# Patient Record
Sex: Female | Born: 1960 | Race: Black or African American | Hispanic: No | Smoking: Never smoker
Health system: Southern US, Community
[De-identification: ages and names within clinical notes are randomized; demographics above are authoritative.]

## PROBLEM LIST (undated history)

## (undated) DIAGNOSIS — I10 Essential (primary) hypertension: Secondary | ICD-10-CM

## (undated) DIAGNOSIS — E119 Type 2 diabetes mellitus without complications: Secondary | ICD-10-CM

## (undated) DIAGNOSIS — Z8489 Family history of other specified conditions: Secondary | ICD-10-CM

## (undated) DIAGNOSIS — E785 Hyperlipidemia, unspecified: Secondary | ICD-10-CM

## (undated) DIAGNOSIS — R7301 Impaired fasting glucose: Secondary | ICD-10-CM

## (undated) HISTORY — PX: ENDOMETRIAL ABLATION: SHX621

## (undated) HISTORY — DX: Hyperlipidemia, unspecified: E78.5

## (undated) HISTORY — DX: Impaired fasting glucose: R73.01

## (undated) HISTORY — PX: TONSILLECTOMY: SUR1361

---

## 1985-06-21 HISTORY — PX: TUBAL LIGATION: SHX77

## 2005-12-05 ENCOUNTER — Emergency Department (HOSPITAL_COMMUNITY): Admission: EM | Admit: 2005-12-05 | Discharge: 2005-12-05 | Payer: Self-pay | Admitting: Emergency Medicine

## 2005-12-09 ENCOUNTER — Ambulatory Visit: Payer: Self-pay | Admitting: Cardiology

## 2005-12-09 ENCOUNTER — Inpatient Hospital Stay (HOSPITAL_COMMUNITY): Admission: EM | Admit: 2005-12-09 | Discharge: 2005-12-11 | Payer: Self-pay | Admitting: Emergency Medicine

## 2005-12-10 ENCOUNTER — Encounter: Payer: Self-pay | Admitting: Cardiology

## 2005-12-16 ENCOUNTER — Ambulatory Visit (HOSPITAL_COMMUNITY): Admission: RE | Admit: 2005-12-16 | Discharge: 2005-12-16 | Payer: Self-pay | Admitting: Obstetrics and Gynecology

## 2006-04-01 ENCOUNTER — Encounter (INDEPENDENT_AMBULATORY_CARE_PROVIDER_SITE_OTHER): Payer: Self-pay | Admitting: Specialist

## 2006-04-01 ENCOUNTER — Ambulatory Visit (HOSPITAL_COMMUNITY): Admission: RE | Admit: 2006-04-01 | Discharge: 2006-04-01 | Payer: Self-pay | Admitting: Obstetrics and Gynecology

## 2008-01-15 ENCOUNTER — Ambulatory Visit (HOSPITAL_COMMUNITY): Admission: RE | Admit: 2008-01-15 | Discharge: 2008-01-15 | Payer: Self-pay | Admitting: Family Medicine

## 2009-06-12 ENCOUNTER — Ambulatory Visit (HOSPITAL_COMMUNITY): Admission: RE | Admit: 2009-06-12 | Discharge: 2009-06-12 | Payer: Self-pay | Admitting: Obstetrics and Gynecology

## 2010-07-09 ENCOUNTER — Ambulatory Visit (HOSPITAL_COMMUNITY)
Admission: RE | Admit: 2010-07-09 | Discharge: 2010-07-09 | Payer: Self-pay | Source: Home / Self Care | Attending: Obstetrics and Gynecology | Admitting: Obstetrics and Gynecology

## 2010-11-06 NOTE — Discharge Summary (Signed)
[name], [name]                  ACCOUNT NO.:  [id number]   MEDICAL RECORD NO.:  [id number]          PATIENT TYPE:  INP   LOCATION:  1426                         FACILITY:  [facility]   PHYSICIAN:  Lonia Blood, M.D.      DATE OF BIRTH:  [birth year]   DATE OF ADMISSION:  12/09/2005  DATE OF DISCHARGE:  12/11/2005                                 DISCHARGE SUMMARY   PRIMARY CARE PHYSICIAN:  Dr. Gerda Diss of [place], so she is unassigned  here.   DISCHARGE DIAGNOSES:  1.  Blood loss anemia.  2.  Heavy menses with prolonged vaginal bleed.  3.  Transient non-cardiac chest pain, myocardial infarction ruled out.  4.  Hypertension.   DISCHARGE MEDICATIONS:  1.  Hydrochlorothiazide 25 mg daily.  2.  Lisinopril 20 mg daily.  3.  Chromagen 40 two tablets daily.   DISPOSITION:  The patient is being discharged home with stable hemoglobin.  She is to follow up with Dr. Ambrose Mantle and to call his office on Monday.  She  might end up requiring and having a total hysterectomy at some point.   PROCEDURES PERFORMED:  A 2-D echocardiogram performed on December 10, 2005  showed overall normal left ventricular function with moderately increased  wall thickness of the left ventricle.   CONSULTATIONS:  None.   BRIEF HISTORY AND PHYSICAL:  Please refer to dictated history and physical  by Dr. Michaelyn Barter.  In short, however, this is a 50 year old female  who has been having heavy periods for a couple of months now.  She had a  syncopal episode apparently last week where she was found to have a low  hemoglobin.  She was subsequently told that her low hemoglobin has improved.  She was placed on Chromagen forte.  This time around, she came to visit with  a new gynecologist here in [place], who is Dr. Ambrose Mantle.  While at that  visit, she complained of chest pain.  Hence, she was sent to the emergency  room.  In the emergency room, the patient was found to be anemic with a  hemoglobin of 8.5.  She was also  found to be bleeding.  She was subsequently  admitted for evaluation of both her chest pain and her low hemoglobin.   HOSPITAL COURSE:  1.  Chest pain.  The patient was admitted to a telemetry bed.  She was      started on pain control, but no aspirin secondary to her bleeding.  She      got cardiac enzymes checked x3 that were all negative so far.  Her chest      pain was, therefore, felt to be non-cardiac in origin.  She did not have      chest pain, in fact, since her arrival in the ED.  She described the      previous chest pain she had as a dull ache that was transient and has      since gone.  At this point, no further cardiac work-up was felt to be      warranted,  except for a 2-D echo that was essentially as indicated      above.  2.  Anemia.  The patient's hemoglobin was low, as indicated, most likely      from blood loss.  She received 2 packs of red blood cells.  Her      hemoglobin has stabilized around 9.5 yesterday and today.  3.  Vaginal bleed.  The patient will probably require a hysterectomy.  Since      she has an established gynecologist, she is asked to call him the first      thing on Monday to scheduled an appointment and see if he plans to do      anything about it.  4.  Hypertension.  Her blood pressure was well-controlled on her home      medications in the hospital.  We, therefore, did not make any new      changes to her medicines this time around.  We did check a fasting lipid      panel and a TSH, which were all normal.      Lonia Blood, M.D.  Electronically Signed     LG/MEDQ  D:  12/11/2005  T:  12/11/2005  Job:  [id number]

## 2010-11-06 NOTE — H&P (Signed)
[name], PEDONE                  ACCOUNT NO.:  [id number]   MEDICAL RECORD NO.:  [id number]          PATIENT TYPE:  AMB   LOCATION:  SDC                           FACILITY:  [facility]   PHYSICIAN:  Janine Limbo, M.D.DATE OF BIRTH:  1960-09-23   DATE OF ADMISSION:  04/01/2006  DATE OF DISCHARGE:                                HISTORY & PHYSICAL   HISTORY OF PRESENT ILLNESS:  [name] is a 50 year old female, para 3-0-0-3,  who present for hysteroscopy with dilation and curettage and resection of  endometrial polyp.  She will also have a Novasure ablation.  The patient  complains of menometrorrhagia.  Her hemoglobin has been as low as 5.5.  The  patient had an ultrasound that showed polyps within the endometrial cavity.  The uterus itself measured 10.2 by 6.5 cm.  An endometrial biopsy was  benign.  Her Pap smear was within normal limits.  Her mammogram was within  normal limits.  The patient is status post tubal ligation.  The patient does  not desire a hysterectomy.   OBSTETRICAL HISTORY:  The patient has had three term vaginal deliveries.   ALLERGIES:  NO KNOWN DRUG ALLERGIES.   PAST MEDICAL HISTORY:  The patient has hypertension and she currently takes  Lisinopril and hydrochlorothiazide.   SOCIAL HISTORY:  The patient denies cigarette use, alcohol use, and  recreational drug use.   REVIEW OF SYSTEMS:  See history of present illness.   FAMILY HISTORY:  The patient has a family history of hypertension and  diabetes.   PHYSICAL EXAMINATION:  VITAL SIGNS:  Weight is 254 pounds, height is 5 feet  7 inches.  HEENT:  Ears within normal limits.  CHEST:  Clear.  HEART:  Regular rate and rhythm.  BREASTS:  Without masses.  ABDOMEN:  Nontender.  EXTREMITIES: Grossly normal.  NEUROLOGIC:  Exam is grossly normal.  PELVIC:  External genitalia are normal.  Vagina is normal.  Cervix is  nontender.  Uterus is upper limits of normal size and regular.  Adnexa have  no masses,  rectovaginal exam confirms.   ASSESSMENT:  1. Metromenorrhagia.  2. Severe anemia.  3. Hypertension.  4. Obesity (weight 254 pounds).   PLAN:  The patient will undergo hysteroscopy with resection of polyps.  She  will also have a dilatation and curettage followed by Novasure ablation.  The patient understands the indications for her procedure and she accepts  the risk of, but not limited to, anesthetic complications, bleeding,  infections, and possible damage to the surrounding organs.      Janine Limbo, M.D.  Electronically Signed     AVS/MEDQ  D:  03/31/2006  T:  04/01/2006  Job:  [id number]

## 2010-11-06 NOTE — H&P (Signed)
[name], [name]                  ACCOUNT NO.:  [id number]   MEDICAL RECORD NO.:  [id number]          PATIENT TYPE:  EMS   LOCATION:  ED                           FACILITY:  [facility]   PHYSICIAN:  Michaelyn Barter, M.D. DATE OF BIRTH:  08-02-1960   DATE OF ADMISSION:  12/09/2005  DATE OF DISCHARGE:                                HISTORY & PHYSICAL   PRIMARY CARE PHYSICIAN:  Dr. Gerda Diss; therefore she is unassigned.   CHIEF COMPLAINT:  Heavy periods, shortness of breath, chest pain.   HISTORY OF PRESENT ILLNESS:  [name] is a 50 year old female who states  that she started her menstruation back in October 2006.  She has been  constantly bleeding since that time. She states that she bleeds every day.  She saw her gynecologist, Dr. Cordelia Pen, in [place] back in October or  November of last year.  A Pap smear was done at that particular time.  However, she never found out the results. Her bleeding continued and she  went back to see Dr. Bernette Redbird in February.  When she requested to know the  results of the prior Pap smear she was told that they could not locate the  results, therefore a repeat Pap smear was completed. The patient does not  know the results of the second Pap smear either. She states that  occasionally her bleeding will slow down to the point that she only spots.  However, it never completely goes away.  On Thursday, December 02, 2005, she  went to see her primary care physician and was seen by his nurse  practitioner instead, Ms. Gwyneth Sprout.  She was told that her hemoglobin  was 5.8 and she was sent to [facility] to have bloodwork completed.  This last Friday she was told that her iron was low, she was started on  Chromagen forte two tablets daily. Two days later, this past Sunday morning,  she got up to go to church.  She states that she felt very tired and short  of breath, especially when walking short distances across her home. She had  to sit down as a result of  the shortness of breath.  She went to church;  however, when she went to pray in church she felt as though her lips were  numb.  She also felt dizzy every time she went to stand. She went back to  [facility] to have her hemoglobin rechecked and was told that it was 8.  Today, she went to see Dr. Ambrose Mantle, a new gynecologist, and at that time she  complained of chest pain. She states that she has had at least six to seven  episodes of chest pain since this past weekend. The pain is described as  sharp. She also has some jaw pain during those episodes of chest pain. She  states that rest tends to ease her pain. She states that when she is  cleaning her house, in particular sweeping her floor, she tends to have her  chest pain triggered. After mentioning this to Dr. Ambrose Mantle he referred  her to  the hospital for further evaluation. She states that the chest pain reaches  a 5/10 in intensity. She denies having any orthopnea, no PND. She states  that she has had a couple of episodes where it felt as though her heartbeat  was racing. She also states that currently she is menstruating.   PAST MEDICAL HISTORY:  1.  Hypertension.  2.  Anemia.   PAST SURGICAL HISTORY:  1.  Tubal ligation.  2.  Anemia.   PAST SURGICAL HISTORY:  1.  Tubal ligation.  2.  Tonsillectomy.   ALLERGIES:  None.   HOME MEDICATIONS:  1.  Lisinopril 20 mg p.o. daily.  2.  Hydrochlorothiazide 25 mg p.o. daily.  3.  Chromagen Forte two tablets daily.   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 Cigarette--never.  Alcohol--never.   FAMILY HISTORY:  Mother has history of hypercholesterolemia. Father had a  heart attack while in his 81s. He has a history of hypertension, diabetes  mellitus, and also status post pacemaker placement.   REVIEW OF SYSTEMS:  As per HPI.  Otherwise, all other systems are negative.   PHYSICAL EXAMINATION:  GENERAL: The patient is awake, she is  cooperative.  She is in no obvious distress.  VITAL SIGNS: Temperature 98.9, blood pressure 156/94, heart rate 107,  respirations 17, O2 saturation 98% on room air.  HEENT:  Normocephalic and atraumatic. Anicteric. Extraocular movements are  intact. Pupils equal and reactive to light. Oral mucosa is pink. No  exudates.  NECK: Supple with no JVD or lymphadenopathy. Thyroid is not palpable.  CARDIAC: S1 and S2 is present. Regular rate and rhythm. No S3 or S4. No  murmurs, rubs, or gallops.  RESPIRATORY:  Clear.  ABDOMEN: Soft,  nontender, nondistended.  Hypoactive bowel sounds. No  organomegaly.  EXTREMITIES: No leg edema.  NEUROLOGIC: The patient is alert and oriented times three.  MUSCULOSKELETAL:  5/5 upper and lower extremity strength.   LABORATORY DATA:  White blood cell count 7.4, hemoglobin 8.5, hematocrit  27.6, platelet count 397,000. Sodium 137, potassium 3.9, chloride 101, CO2  28, BUN 22, creatinine 0.9, glucose 102, calcium 10.2. CK-MB POC 1.8.  Troponin-I, POC less than 0.05.  Myoglobin, POC 41.7.   EKG reveals normal sinus rhythm, no Q waves.   ASSESSMENT/PLAN:  1.  Severe anemia. This most is likely related to the patient's excessive      bleeding occurring during extended menstrual cycle/menorrhagia. Will      type and cross the patient two units of packed red blood cells and then      transfuse them. Will consider consultation with gynecology. The patient      more than likely may need a hysterectomy at some time in the near      future. Will also repeat the patient's hemoglobin and hematocrit after      the transfusion is completed.  2.  Chest pain. This may have been triggered by the severe anemia that the      patient has experienced.  In fact, she may have experienced some      ischemia secondary to severe decline in her hemoglobin. Will cycle the      patient's     cardiac enzymes for now times three every eight hours apart, and will      consider  consultation with cardiology.  3.  Hypertension. Will resume the patient's previously prescribed  antihypertensive medications.  4.  Gastrointestinal prophylaxis.  Will provide Protonix.      Michaelyn Barter, M.D.  Electronically Signed     OR/MEDQ  D:  12/09/2005  T:  12/09/2005  Job:  [id number]

## 2010-11-06 NOTE — Op Note (Signed)
NAMERHYTHM, GUBBELS                  ACCOUNT NO.:  [id number]   MEDICAL RECORD NO.:  [id number]          PATIENT TYPE:  AMB   LOCATION:  SDC                           FACILITY:  [facility]   PHYSICIAN:  Janine Limbo, M.D.DATE OF BIRTH:  1960/07/14   DATE OF PROCEDURE:  04/01/2006  DATE OF DISCHARGE:  04/01/2006                                 OPERATIVE REPORT   PREOPERATIVE DIAGNOSES:  1. Anemia.  2. Menometrorrhagia.  3. Obesity.  4. Endometrial polyps.  5. Hypertension.   POSTOPERATIVE DIAGNOSES:  1. Anemia.  2. Menometrorrhagia.  3. Obesity.  4. Endometrial polyps.  5. Hypertension.   PROCEDURE:  1. Hysteroscopy with resection.  2. Dilatation and curettage.  3. NovaSure ablation of the endometrium.   SURGEON:  Dr. Leonard Schwartz.   ANESTHETIC:  General.   DISPOSITION:  [name] is a 50 year old female, para 3-0-0-3, who presents  with the above-mentioned diagnoses.  She does not desire hysterectomy.  She  understands the indications for her surgical procedure, and she accepts the  risk of, but not limited to, anesthetic complications, bleeding, infections,  and possible damage to the surrounding organs.   FINDINGS:  The uterus was approximately 8-10 weeks in size.  The uterus  sounded to 9 cm.  The endometrial cavity showed polyps that were relatively  small, measuring 1 cm or less in size.  The tubal ostia were easily seen.  There was no evidence of pathology.  No adnexal masses were appreciated.   PROCEDURE:  The patient was taken to the operating room where a general  anesthetic was given.  The patient's abdomen, perineum, and vagina were  prepped with multiple layers of Betadine.  Examination under anesthesia was  performed.  The patient was sterilely draped after the bladder was drained  of urine.  A paracervical block was placed using 10 mL of 0.5% Marcaine with  epinephrine.  An additional 10 mL of 0.5% Marcaine with epinephrine were  injected  directly into the cervix.  An endocervical curettage was performed.  The uterus sounded to 9 cm.  The cervical length was noted to be 4 cm.  The  cervix was gently dilated.  The operative hysteroscope was inserted, and the  cavity was carefully inspected.  Pictures were taken.  Polyps on the right  upper portion of the endometrium were resected, and polyps on the left lower  portion of the endometrium were resected.  The operative hysteroscope was  then removed, and the cavity was curetted with a medium sharp curette.  The  cavity was felt to be clean at the end of our procedure.  The NovaSure  instrument was then inserted, and the cavity width was noted to be 3.6 cm.  Proper placement was confirmed.  The cavity was tested for integrity, and  there was no evidence of perforation.  We then ablated the uterus for 1  minute and 48 seconds with a power of 99.  The procedure was tolerated well,  and there were no complications.  The NovaSure instrument was removed.  Hemostasis was noted to  be adequate.  The uterus was again examined and was  noted to be firm.  Sponge, needle, and instrument counts were correct.  The  patient was returned to the supine position.  She was awakened from her  anesthetic and taken to the recovery room in stable condition.  She  tolerated her procedure well.  The estimated sorbitol deficit was 90 mL.  Lactated Ringer's was used to irritate the uterine cavity between the  resection and the ablation.  The patient was given Toradol 30 mg IV and 30  mg IM during the operative procedure.   FOLLOW UP INSTRUCTIONS:  The patient will take ibuprofen 600 mg every 6  hours as needed for mild to moderate pain.  She will take Vicodin 1-2  tablets every 4 hours as needed for severe pain.  She will call for  questions or concerns.  She will return to see Dr. Stefano Gaul in 2-3 weeks for  follow-up examination.  She was given a copy of the postoperative  instruction sheet as prepared  by the [facility] for  patients who have undergone a dilatation and curettage.      Janine Limbo, M.D.  Electronically Signed     AVS/MEDQ  D:  04/01/2006  T:  04/03/2006  Job:  [id number]

## 2011-08-04 ENCOUNTER — Other Ambulatory Visit: Payer: Self-pay | Admitting: Obstetrics and Gynecology

## 2011-08-04 ENCOUNTER — Other Ambulatory Visit: Payer: Self-pay | Admitting: Family Medicine

## 2011-08-04 DIAGNOSIS — Z139 Encounter for screening, unspecified: Secondary | ICD-10-CM

## 2011-08-06 ENCOUNTER — Ambulatory Visit (HOSPITAL_COMMUNITY)
Admission: RE | Admit: 2011-08-06 | Discharge: 2011-08-06 | Disposition: A | Discharge status: Home / Self Care | Payer: BC Managed Care – PPO | Source: Ambulatory Visit | Attending: Obstetrics and Gynecology | Admitting: Obstetrics and Gynecology

## 2011-08-06 DIAGNOSIS — Z1231 Encounter for screening mammogram for malignant neoplasm of breast: Secondary | ICD-10-CM | POA: Insufficient documentation

## 2011-08-06 DIAGNOSIS — Z139 Encounter for screening, unspecified: Secondary | ICD-10-CM

## 2011-09-20 DIAGNOSIS — R7301 Impaired fasting glucose: Secondary | ICD-10-CM

## 2011-09-20 HISTORY — DX: Impaired fasting glucose: R73.01

## 2011-10-24 ENCOUNTER — Emergency Department (HOSPITAL_COMMUNITY)
Admission: EM | Admit: 2011-10-24 | Discharge: 2011-10-24 | Disposition: A | Discharge status: Home / Self Care | Payer: BC Managed Care – PPO | Attending: Emergency Medicine | Admitting: Emergency Medicine

## 2011-10-24 ENCOUNTER — Encounter (HOSPITAL_COMMUNITY): Payer: Self-pay

## 2011-10-24 DIAGNOSIS — I10 Essential (primary) hypertension: Secondary | ICD-10-CM | POA: Insufficient documentation

## 2011-10-24 DIAGNOSIS — T503X1A Poisoning by electrolytic, caloric and water-balance agents, accidental (unintentional), initial encounter: Secondary | ICD-10-CM | POA: Insufficient documentation

## 2011-10-24 DIAGNOSIS — R42 Dizziness and giddiness: Secondary | ICD-10-CM | POA: Insufficient documentation

## 2011-10-24 DIAGNOSIS — T50901A Poisoning by unspecified drugs, medicaments and biological substances, accidental (unintentional), initial encounter: Secondary | ICD-10-CM

## 2011-10-24 DIAGNOSIS — T502X1A Poisoning by carbonic-anhydrase inhibitors, benzothiadiazides and other diuretics, accidental (unintentional), initial encounter: Secondary | ICD-10-CM | POA: Insufficient documentation

## 2011-10-24 HISTORY — DX: Essential (primary) hypertension: I10

## 2011-10-24 NOTE — ED Notes (Signed)
Pt reports that she took 1 extra dose of her usual bp med--amlodipine-benaz-10/20mg , apporx. 15 min pta

## 2011-10-24 NOTE — ED Provider Notes (Signed)
History     CSN: [id number]  Arrival date & time 10/24/11  0750   First MD Initiated Contact with Patient 10/24/11 [phone]      Chief Complaint  Patient presents with  . Drug Overdose    (Consider location/radiation/quality/duration/timing/severity/associated sxs/prior treatment) HPI Comments: Pt accidentally took 2 rather 1 of her BP tablets.  She denies any sxs of lightheadedness or feeling as though she is going to pass out.  She wanted to be "checked".  Patient is a 51 y.o. female presenting with Overdose. The history is provided by the patient. No language interpreter was used.  Drug Overdose This is a new problem. The current episode started today (0800). Pertinent negatives include no chest pain, diaphoresis, fatigue, nausea, vertigo, vomiting or weakness.    Past Medical History  Diagnosis Date  . Hypertension     History reviewed. No pertinent past surgical history.  No family history on file.  History  Substance Use Topics  . Smoking status: Never Smoker   . Smokeless tobacco: Not on file  . Alcohol Use: No    OB History    Grav Para Term Preterm Abortions TAB SAB Ect Mult Living                  Review of Systems  Constitutional: Negative for diaphoresis and fatigue.  Respiratory: Negative for shortness of breath.   Cardiovascular: Negative for chest pain.  Gastrointestinal: Negative for nausea and vomiting.  Neurological: Negative for vertigo, syncope, weakness and light-headedness.    Allergies  Review of patient's allergies indicates no known allergies.  Home Medications   Current Outpatient Rx  Name Route Sig Dispense Refill  . AMLODIPINE BESY-BENAZEPRIL HCL 10-20 MG PO CAPS Oral Take 1 capsule by mouth daily.    Marland Kitchen HYDROCHLOROTHIAZIDE 12.5 MG PO CAPS Oral Take 12.5 mg by mouth daily. Ran out      BP 168/98  Pulse 71  Temp(Src) 98.2 F (36.8 C) (Oral)  Resp 20  Ht 5\' 7"  (1.702 m)  Wt 262 lb (118.842 kg)  BMI 41.03 kg/m2  SpO2  97%  Physical Exam  Nursing note and vitals reviewed. Constitutional: She is oriented to person, place, and time. She appears well-developed and well-nourished. No distress.  HENT:  Head: Normocephalic and atraumatic.  Eyes: EOM are normal.  Neck: Normal range of motion.  Cardiovascular: Normal rate, regular rhythm and normal heart sounds.   Pulmonary/Chest: Effort normal and breath sounds normal.  Abdominal: Soft. She exhibits no distension. There is no tenderness.  Musculoskeletal: Normal range of motion.  Neurological: She is alert and oriented to person, place, and time. She has normal strength. No cranial nerve deficit or sensory deficit. Coordination and gait normal. GCS eye subscore is 4. GCS verbal subscore is 5. GCS motor subscore is 6.  Skin: Skin is warm and dry.  Psychiatric: She has a normal mood and affect. Judgment normal.    ED Course  Procedures (including critical care time)  Labs Reviewed - No data to display No results found.   1. Overdose       MDM  Blood pressure re-checked ~ 099 by RN and is still elevated.  Pt is still symptom free.  Pt has been told that if she has any feeling of lightheadedness or other pre-syncopal sxs that she should lie down.  If any sxs don't resolve that she should return to the ED.        Worthy Rancher, [place] 10/24/11 [phone]

## 2011-10-24 NOTE — Discharge Instructions (Signed)
Accidental Overdose  A drug overdose occurs when a chemical substance (drug or medication) is used in amounts large enough to overcome a person. This may result in severe illness or death. This is a type of poisoning. Accidental overdoses of medications or other substances come from a variety of reasons. When this happens accidentally, it is often because the person taking the substance does not know enough about what they have taken. Drugs which commonly cause overdose deaths are alcohol, psychotropic medications (medications which affect the mind), pain medications, illegal drugs (street drugs) such as cocaine and heroin, and multiple drugs taken at the same time. It may result from careless behavior (such as over-indulging at a party). Other causes of overdose may include multiple drug use, a lapse in memory, or drug use after a period of no drug use.   Sometimes overdosing occurs because a person cannot remember if they have taken their medication.   A common unintentional overdose in young children involves multi-vitamins containing iron. Iron is a part of the hemoglobin molecule in blood. It is used to transport oxygen to living cells. When taken in small amounts, iron allows the body to restock hemoglobin. In large amounts, it causes problems in the body. If this overdose is not treated, it can lead to death.  Never take medicines that show signs of tampering or do not seem quite right. Never take medicines in the dark or in poor lighting. Read the label and check each dose of medicine before you take it. When adults are poisoned, it happens most often through carelessness or lack of information. Taking medicines in the dark or taking medicine prescribed for someone else to treat the same type of problem is a dangerous practice.  SYMPTOMS   Symptoms of overdose depend on the medication and amount taken. They can vary from over-activity with stimulant over-dosage, to sleepiness from depressants such as  alcohol, narcotics and tranquilizers. Confusion, dizziness, nausea and vomiting may be present. If problems are severe enough coma and death may result.  DIAGNOSIS   Diagnosis and management are generally straightforward if the drug is known. Otherwise it is more difficult. At times, certain symptoms and signs exhibited by the patient, or blood tests, can reveal the drug in question.   TREATMENT   In an emergency department, most patients can be treated with supportive measures. Antidotes may be available if there has been an overdose of opioids or benzodiazepines. A rapid improvement will often occur if this is the cause of overdose.  At home or away from medical care:   There may be no immediate problems or warning signs in children.   Not everything works well in all cases of poisoning.   Take immediate action. Poisons may act quickly.   If you think someone has swallowed medicine or a household product, and the person is unconscious, having seizures (convulsions), or is not breathing, immediately call for an ambulance.  IF a person is conscious and appears to be doing OK but has swallowed a poison:   Do not wait to see what effect the poison will have. Immediately call a poison control center (listed in the white pages of your telephone book under "Poison Control" or inside the front cover with other emergency numbers). Some poison control centers have TTY capability for the deaf. Check with your local center if you or someone in your family requires this service.   Keep the container so you can read the label on the product for ingredients.     and condition of the person poisoned. Inform them if the person is vomiting, choking, drowsy, shows a change in color or temperature of skin, is conscious or unconscious, or is convulsing.   Do not cause vomiting unless instructed by medical personnel. Do not induce vomiting or force liquids into a  person who is convulsing, unconscious, or very drowsy.  Stay calm and in control.   Activated charcoal also is sometimes used in certain types of poisoning and you may wish to add a supply to your emergency medicines. It is available without a prescription. Call a poison control center before using this medication.  PREVENTION  Thousands of children die every year from unintentional poisoning. This may be from household chemicals, poisoning from carbon monoxide in a car, taking their parent's medications, or simply taking a few iron pills or vitamins with iron. Poisoning comes from unexpected sources.  Store medicines out of the sight and reach of children, preferably in a locked cabinet. Do not keep medications in a food cabinet. Always store your medicines in a secure place. Get rid of expired medications.   If you have children living with you or have them as occasional guests, you should have child-resistant caps on your medicine containers. Keep everything out of reach. Child proof your home.   If you are called to the telephone or to answer the door while you are taking a medicine, take the container with you or put the medicine out of the reach of small children.   Do not take your medication in front of children. Do not tell your child how good a medication is and how good it is for them. They may get the idea it is more of a treat.   If you are an adult and have accidentally taken an overdose, you need to consider how this happened and what can be done to prevent it from happening again. If this was from a street drug or alcohol, determine if there is a problem that needs addressing. If you are not sure a problems exists, it is easy to talk to a professional and ask them if they think you have a problem. It is better to handle this problem in this way before it happens again and has a much worse consequence.  Document Released: 08/21/2004 Document Revised: 05/27/2011 Document Reviewed:  01/27/2009 [facility] Patient Information 2012 [place], [place].   Return to the ED if you have any symptoms of lightheadedness or feeling like you are going to pass out that don't resolve with lying down.

## 2011-10-25 NOTE — ED Provider Notes (Signed)
Medical screening examination/treatment/procedure(s) were conducted as a shared visit with non-physician practitioner(s) and myself.  I personally evaluated the patient during the encounter.  Patient is alert and hemodynamically stable. can discharge  Donnetta Hutching, MD 10/25/11 [id number]

## 2011-11-13 ENCOUNTER — Emergency Department (HOSPITAL_COMMUNITY)
Admission: EM | Admit: 2011-11-13 | Discharge: 2011-11-13 | Disposition: A | Discharge status: Home / Self Care | Payer: BC Managed Care – PPO | Attending: Emergency Medicine | Admitting: Emergency Medicine

## 2011-11-13 ENCOUNTER — Emergency Department (HOSPITAL_COMMUNITY): Payer: BC Managed Care – PPO

## 2011-11-13 ENCOUNTER — Encounter (HOSPITAL_COMMUNITY): Payer: Self-pay

## 2011-11-13 DIAGNOSIS — I10 Essential (primary) hypertension: Secondary | ICD-10-CM | POA: Insufficient documentation

## 2011-11-13 DIAGNOSIS — M5137 Other intervertebral disc degeneration, lumbosacral region: Secondary | ICD-10-CM | POA: Insufficient documentation

## 2011-11-13 DIAGNOSIS — M51379 Other intervertebral disc degeneration, lumbosacral region without mention of lumbar back pain or lower extremity pain: Secondary | ICD-10-CM | POA: Insufficient documentation

## 2011-11-13 DIAGNOSIS — M549 Dorsalgia, unspecified: Secondary | ICD-10-CM

## 2011-11-13 LAB — URINALYSIS, ROUTINE W REFLEX MICROSCOPIC
Hgb urine dipstick: NEGATIVE
Ketones, ur: NEGATIVE mg/dL
Leukocytes, UA: NEGATIVE
Protein, ur: 30 mg/dL — AB
Specific Gravity, Urine: >1.03 — ABNORMAL HIGH (ref 1.005–1.030)
pH: 5.5 (ref 5.0–8.0)

## 2011-11-13 LAB — URINE MICROSCOPIC-ADD ON

## 2011-11-13 MED ORDER — NAPROXEN 500 MG PO TABS: 500.0000 mg | ORAL_TABLET | Freq: Two times a day (BID) | ORAL | Status: AC

## 2011-11-13 MED ORDER — OXYCODONE-ACETAMINOPHEN 5-325 MG PO TABS
1.0000 | ORAL_TABLET | ORAL | Status: AC | PRN
Start: 2011-11-13 — End: 2011-11-23

## 2011-11-13 MED ORDER — OXYCODONE-ACETAMINOPHEN 5-325 MG PO TABS
2.0000 | ORAL_TABLET | Freq: Once | ORAL | Status: AC
  Administered 2011-11-13: 2 via ORAL
  Filled 2011-11-13: qty 2

## 2011-11-13 NOTE — Discharge Instructions (Signed)
Your back pain should be treated with medicines such as ibuprofen or aleve and this back pain should get better over the next 2 weeks.  However if you develop severe or worsening pain, low back pain with fever, numbness, weakness or inability to walk or urinate, you should return to the ER immediately.  Please follow up with your doctor this week for a recheck if still having symptoms.  

## 2011-11-13 NOTE — ED Provider Notes (Signed)
History     CSN: [id number]  Arrival date & time 11/13/11  0520   First MD Initiated Contact with Patient 11/13/11 [phone]      Chief Complaint  Patient presents with  . Back Pain    (Consider location/radiation/quality/duration/timing/severity/associated sxs/prior treatment) HPI Comments: 51 year old female with a history of hypertension who presents to the hospital with a complaint of right lower back pain. She states this started yesterday when she awoke, she has had persistent right lower back pain which seems to be worse when she lays down and better when she stands up. This is described as both sharp and stabbing and a pressure sensation in the right lower back. She states that sometimes this pain radiates into her buttock and all the way down to her foot. She has had ibuprofen last night approximately 8 hours ago with minimal relief. She denies a history of cancer, IV drug use, any needle use, fevers, chills, vomiting, diarrhea, dysuria, urinary retention or incontinence and also denies numbness weakness or difficulty walking. She denies any radiation of the pain into the abdomen. She does have mild nausea intermittently.    Patient is a 51 y.o. female presenting with back pain. The history is provided by the patient, the spouse and medical records.  Back Pain     Past Medical History  Diagnosis Date  . Hypertension     History reviewed. No pertinent past surgical history.  No family history on file.  History  Substance Use Topics  . Smoking status: Never Smoker   . Smokeless tobacco: Not on file  . Alcohol Use: No    OB History    Grav Para Term Preterm Abortions TAB SAB Ect Mult Living                  Review of Systems  Musculoskeletal: Positive for back pain.  All other systems reviewed and are negative.    Allergies  Review of patient's allergies indicates no known allergies.  Home Medications   Current Outpatient Rx  Name Route Sig Dispense Refill  .  AMLODIPINE BESY-BENAZEPRIL HCL 10-20 MG PO CAPS Oral Take 1 capsule by mouth daily.    Marland Kitchen HYDROCHLOROTHIAZIDE 12.5 MG PO CAPS Oral Take 12.5 mg by mouth daily. Ran out    . NAPROXEN 500 MG PO TABS Oral Take 1 tablet (500 mg total) by mouth 2 (two) times daily with a meal. 30 tablet 0  . OXYCODONE-ACETAMINOPHEN 5-325 MG PO TABS Oral Take 1 tablet by mouth every 4 (four) hours as needed for pain. May take 2 tablets PO q 6 hours for severe pain - Do not take with Tylenol as this tablet already contains tylenol 15 tablet 0    BP 179/97  Pulse 87  Temp(Src) 98 F (36.7 C) (Oral)  Resp 20  Ht 5\' 7"  (1.702 m)  Wt 240 lb (108.863 kg)  BMI 37.59 kg/m2  SpO2 99%  Physical Exam  Nursing note and vitals reviewed. Constitutional: She appears well-developed and well-nourished. No distress.       The patient appears in mild discomfort  HENT:  Head: Normocephalic and atraumatic.  Mouth/Throat: Oropharynx is clear and moist. No oropharyngeal exudate.  Eyes: Conjunctivae and EOM are normal. Pupils are equal, round, and reactive to light. Right eye exhibits no discharge. Left eye exhibits no discharge. No scleral icterus.  Neck: Normal range of motion. Neck supple. No JVD present. No thyromegaly present.  Cardiovascular: Normal rate, regular rhythm, normal heart sounds and  intact distal pulses.  Exam reveals no gallop and no friction rub.   No murmur heard. Pulmonary/Chest: Effort normal and breath sounds normal. No respiratory distress. She has no wheezes. She has no rales.  Abdominal: Soft. Bowel sounds are normal. She exhibits no distension and no mass. There is no tenderness.       Obese but soft and nontender  Musculoskeletal: Normal range of motion. She exhibits no edema and no tenderness.       No reproducible tenderness to palpation over the lumbar spine or the paraspinal muscles. No tenderness in the upper buttock on the right, no tenderness with range of motion of the bilateral lower  extremities. No pain with stretching of the iliopsoas muscle on the right  Lymphadenopathy:    She has no cervical adenopathy.  Neurological: She is alert. Coordination normal.       Normal strength, normal sensation of the bilateral upper and lower extremities, normal cranial nerves III through XII, patient is able to get up out of the bed by herself and ambulate with minimal difficulty.  Skin: Skin is warm and dry. No rash noted. No erythema.  Psychiatric: She has a normal mood and affect. Her behavior is normal.    ED Course  Procedures (including critical care time)  Labs Reviewed  URINALYSIS, ROUTINE W REFLEX MICROSCOPIC - Abnormal; Notable for the following:    Specific Gravity, Urine >1.030 (*)    Protein, ur 30 (*)    All other components within normal limits  URINE MICROSCOPIC-ADD ON   Dg Lumbar Spine Complete  11/13/2011  *RADIOLOGY REPORT*  Clinical Data: Lower mid and right-sided back pain for 1 day.  No history of injury.  LUMBAR SPINE - COMPLETE 4+ VIEW  Comparison: None.  Findings: Five lumbar type vertebrae.  Slight anterior subluxation of L4 on L5 associated with disc space narrowing and hypertrophic degenerative change.  Prominent sclerosis and productive change in the facet joints at that level may represent postoperative fusion or prominent degenerative changes.  Otherwise normal alignment of the lumbar vertebrae.  No vertebral compression deformities.  No focal bone lesion or bone destruction.  Bone cortex and trabecular architecture appear intact.  IMPRESSION: Degenerative changes in the lumbar spine.  Slight anterior subluxation of L4 on L5, likely degenerative.  No displaced fractures identified.  Original Report Authenticated By: Marlon Pel, M.D.     1. Back pain       MDM  Overall the neurologic exam is normal, the lower back exam is normal and there is no tenderness in the abdomen. Her vital signs reflecting mild hypertension. We'll obtain a urinalysis  to rule out UTI, pyelonephritis, a lumbar series to rule out other sources of pain, Percocet for pain   Urinalysis reviewed, other than mild dehydration there is no signs of infection. X-rays reviewed by myself, degenerative change, slight subluxation, no other signs of fracture or dislocation. I have reevaluated the patient and she states that she feels much much better after getting the medications. She was given Percocet and will be discharged with Naprosyn and Percocet. I have cautioned her to followup with her family Dr. in the next 2 days for a recheck or to return to the hospital for severe or worsening symptoms including red flags for pathologic back pain. At this time she does not have any of these red flags in appears stable for discharge. She has voiced her understanding for the indications for return.  Discharge Prescriptions include:  Naprosyn Percocet  Vida Roller, MD 11/13/11 [phone]

## 2011-11-13 NOTE — ED Notes (Addendum)
Pt states she woke the previous morning with back pain. Denies injury. Pt ambulated to room with steady gate

## 2013-03-20 ENCOUNTER — Other Ambulatory Visit: Payer: Self-pay | Admitting: Obstetrics and Gynecology

## 2013-03-20 DIAGNOSIS — Z139 Encounter for screening, unspecified: Secondary | ICD-10-CM

## 2013-03-26 ENCOUNTER — Ambulatory Visit (HOSPITAL_COMMUNITY)
Admission: RE | Admit: 2013-03-26 | Discharge: 2013-03-26 | Disposition: A | Discharge status: Home / Self Care | Payer: BC Managed Care – PPO | Source: Ambulatory Visit | Attending: Obstetrics and Gynecology | Admitting: Obstetrics and Gynecology

## 2013-03-26 DIAGNOSIS — Z1231 Encounter for screening mammogram for malignant neoplasm of breast: Secondary | ICD-10-CM | POA: Insufficient documentation

## 2013-03-26 DIAGNOSIS — Z139 Encounter for screening, unspecified: Secondary | ICD-10-CM

## 2013-12-28 ENCOUNTER — Telehealth: Payer: Self-pay | Admitting: Family Medicine

## 2013-12-28 NOTE — Telephone Encounter (Signed)
Please look over results to patients blood work in red folder.

## 2014-01-01 NOTE — Telephone Encounter (Signed)
Discussed with patietn.

## 2014-01-01 NOTE — Telephone Encounter (Signed)
The patient sent lab work here for review. Overall looks good but vitamin D level is low. I would recommend a office visit has not been seen here in almost 2 years. Try calling patient if unable to connect it is fine to send a card. (Cholesterol sugar kidney function liver functions all looked good, vitamin D was 22)

## 2014-08-26 ENCOUNTER — Encounter: Payer: Self-pay | Admitting: Nurse Practitioner

## 2014-08-26 ENCOUNTER — Ambulatory Visit (INDEPENDENT_AMBULATORY_CARE_PROVIDER_SITE_OTHER): Payer: BLUE CROSS/BLUE SHIELD | Admitting: Nurse Practitioner

## 2014-08-26 VITALS — BP 130/80 | Ht 66.75 in | Wt 277.0 lb

## 2014-08-26 DIAGNOSIS — Z124 Encounter for screening for malignant neoplasm of cervix: Secondary | ICD-10-CM

## 2014-08-26 DIAGNOSIS — Z1151 Encounter for screening for human papillomavirus (HPV): Secondary | ICD-10-CM

## 2014-08-26 DIAGNOSIS — Z0000 Encounter for general adult medical examination without abnormal findings: Secondary | ICD-10-CM | POA: Diagnosis not present

## 2014-08-26 NOTE — Patient Instructions (Signed)
Virtual colonoscopy

## 2014-08-29 LAB — PAP IG AND HPV HIGH-RISK
HPV, HIGH-RISK: NEGATIVE
PAP SMEAR COMMENT: 0

## 2014-08-29 LAB — SPECIMEN STATUS REPORT

## 2014-08-29 NOTE — Progress Notes (Signed)
Patient notified and verbalized understanding of the test results. No further questions. 

## 2014-08-31 ENCOUNTER — Encounter: Payer: Self-pay | Admitting: Nurse Practitioner

## 2014-08-31 NOTE — Progress Notes (Signed)
   Subjective:    Patient ID: [name], female    DOB: [birth year], 54 y.o.   MRN: [id number]  HPI presents for her wellness exam. Has had ablation. No cycle for at least 3-4 years. No pelvic pain. Same sexual partner. Needs vision exam. Regular dental exams. Has had labs done at work. Mammogram to be done next week at work. Active job; some walking for activity.     Review of Systems  Constitutional: Negative for fever, activity change, appetite change and fatigue.  HENT: Positive for rhinorrhea. Negative for dental problem, ear pain, sinus pressure and sore throat.   Respiratory: Negative for cough, chest tightness, shortness of breath and wheezing.   Cardiovascular: Negative for chest pain and leg swelling.  Gastrointestinal: Negative for nausea, vomiting, abdominal pain, diarrhea, constipation, blood in stool and abdominal distention.  Genitourinary: Negative for dysuria, urgency, frequency, vaginal bleeding, vaginal discharge, enuresis, difficulty urinating, genital sores, menstrual problem and pelvic pain.       Objective:   Physical Exam  Constitutional: She is oriented to person, place, and time. She appears well-developed. No distress.  HENT:  Right Ear: External ear normal.  Left Ear: External ear normal.  Mouth/Throat: Oropharynx is clear and moist.  Neck: Normal range of motion. Neck supple. No tracheal deviation present. No thyromegaly present.  Cardiovascular: Normal rate, regular rhythm and normal heart sounds.  Exam reveals no gallop.   No murmur heard. Pulmonary/Chest: Effort normal and breath sounds normal.  Abdominal: Soft. She exhibits no distension. There is no tenderness.  Genitourinary: Vagina normal and uterus normal. No vaginal discharge found.  External GU: no rashes or lesions. Cervix nl in appearance. No CMT. Bimanual exam: no tenderness or obvious masses. Exam limited due to extreme abd girth. Rectal exam: no masses; no stool for hemoccult.     Musculoskeletal: She exhibits no edema.  Lymphadenopathy:    She has no cervical adenopathy.  Neurological: She is alert and oriented to person, place, and time.  Skin: Skin is warm and dry. No rash noted.  Psychiatric: She has a normal mood and affect. Her behavior is normal.  Vitals reviewed. Breast exam: no masses; axillae no adenopathy.         Assessment & Plan:  Routine general medical examination at a health care facility - Plan: Pap IG and HPV (high risk) DNA detection, POC Hemoccult Bld/Stl (3-Cd Home Screen), POC Hemoccult Bld/Stl (3-Cd Home Screen), CANCELED: POC Hemoccult Bld/Stl (3-Cd Home Screen), CANCELED: POC Hemoccult Bld/Stl (3-Cd Home Screen)  Screening for cervical cancer - Plan: Pap IG and HPV (high risk) DNA detection  Screening for HPV (human papillomavirus) - Plan: Pap IG and HPV (high risk) DNA detection  Patient to get following labs done through work: fasting insulin, HgbA1C and vitamin D level. Encouraged colonoscopy; given information. Encouraged healthy diet, activity and weight loss.  Return in about 6 months (around 02/26/2015).

## 2014-09-05 ENCOUNTER — Encounter: Payer: Self-pay | Admitting: *Deleted

## 2015-02-17 ENCOUNTER — Ambulatory Visit: Payer: BLUE CROSS/BLUE SHIELD | Admitting: Nurse Practitioner

## 2015-05-01 ENCOUNTER — Ambulatory Visit (INDEPENDENT_AMBULATORY_CARE_PROVIDER_SITE_OTHER): Payer: BLUE CROSS/BLUE SHIELD | Admitting: Family Medicine

## 2015-05-01 ENCOUNTER — Encounter: Payer: Self-pay | Admitting: Family Medicine

## 2015-05-01 VITALS — BP 128/84 | Ht 66.0 in | Wt 270.0 lb

## 2015-05-01 DIAGNOSIS — I10 Essential (primary) hypertension: Secondary | ICD-10-CM | POA: Insufficient documentation

## 2015-05-01 DIAGNOSIS — E785 Hyperlipidemia, unspecified: Secondary | ICD-10-CM | POA: Diagnosis not present

## 2015-05-01 DIAGNOSIS — R7401 Elevation of levels of liver transaminase levels: Secondary | ICD-10-CM | POA: Insufficient documentation

## 2015-05-01 DIAGNOSIS — R739 Hyperglycemia, unspecified: Secondary | ICD-10-CM

## 2015-05-01 DIAGNOSIS — R740 Nonspecific elevation of levels of transaminase and lactic acid dehydrogenase [LDH]: Secondary | ICD-10-CM | POA: Diagnosis not present

## 2015-05-01 DIAGNOSIS — E1165 Type 2 diabetes mellitus with hyperglycemia: Secondary | ICD-10-CM | POA: Diagnosis not present

## 2015-05-01 DIAGNOSIS — IMO0001 Reserved for inherently not codable concepts without codable children: Secondary | ICD-10-CM

## 2015-05-01 DIAGNOSIS — IMO0002 Reserved for concepts with insufficient information to code with codable children: Secondary | ICD-10-CM | POA: Insufficient documentation

## 2015-05-01 LAB — POCT GLYCOSYLATED HEMOGLOBIN (HGB A1C): Hemoglobin A1C: 10.1

## 2015-05-01 MED ORDER — INDAPAMIDE 2.5 MG PO TABS: 2.5000 mg | ORAL_TABLET | Freq: Every day | ORAL | Status: DC

## 2015-05-01 MED ORDER — BLOOD GLUCOSE MONITOR KIT: PACK | Status: DC

## 2015-05-01 MED ORDER — METFORMIN HCL 500 MG PO TABS: 500.0000 mg | ORAL_TABLET | Freq: Two times a day (BID) | ORAL | Status: DC

## 2015-05-01 NOTE — Progress Notes (Signed)
   Subjective:    Patient ID: [name], female    DOB: 1960/12/03, 54 y.o.   MRN: [id number]  HPIpt following up on test results. Had bw at work. Pt 's blood sugar was 161. Pt was fasting.  A1C today. 10.1 Pt states no other concerns.  Declines flu vaccine.  Patient states that she knew that she was having some health problems she had been feeling well over the past few months she's feel fatigue tiredness rundown at times excessive thirst excessive eating she states she has been somewhat emotional since lost of her of her dad due to cancer earlier this year. Patient also going through divorce. Has a family history diabetes and obesity. Patient states she did not really know she had major problems till she had lab work was told her cholesterol liver functions and diabetes tests were abnormal   Review of Systems  Constitutional: Negative for activity change, appetite change and fatigue.  HENT: Negative for congestion.   Respiratory: Negative for cough.   Cardiovascular: Negative for chest pain.  Gastrointestinal: Negative for abdominal pain.  Endocrine: Positive for polydipsia and polyphagia.  Neurological: Negative for weakness.  Psychiatric/Behavioral: Negative for confusion.       Objective:   Physical Exam  lungs are clear hearts regular extremities no edema skin warm dry pulses in feet normal.       Assessment & Plan:   elevated liver enzymes-concerning for the possibility of fatty liver repeat liver profile within 2-3 weeks if still significantly elevated will need further workup to exclude other causes causes including liver ultrasound and lab work.  New-onset diabetes poor control start metformin 500 mg half tablet twice a day for the first week then 1 tablet twice daily if significant nausea vomiting or other problems may need to try other medication certainly other medicine may need to be used  Dietary referral recommended   I exam recommended in 3-4 months once  diabetes under better control  Hyperlipidemia the importance of getting this under control will need to be on medication but there is only so much the patient can take on this visit.  25 minutes was spent with the patient. Greater than half the time was spent in discussion and answering questions and counseling regarding the issues that the patient came in for today.   the patient is a send [place] some readings in the course of the next few weeks may need to adjust medications based on this I will put together some additional information and sent to the patient regarding diabetes

## 2015-05-22 ENCOUNTER — Ambulatory Visit: Payer: Self-pay

## 2015-05-29 ENCOUNTER — Ambulatory Visit: Payer: Self-pay

## 2015-06-05 ENCOUNTER — Ambulatory Visit: Payer: Self-pay

## 2015-06-06 ENCOUNTER — Telehealth: Payer: Self-pay | Admitting: Family Medicine

## 2015-06-06 LAB — HEPATIC FUNCTION PANEL
ALT: 83 IU/L — ABNORMAL HIGH (ref 0–32)
AST: 48 IU/L — ABNORMAL HIGH (ref 0–40)
Albumin: 5 g/dL (ref 3.5–5.5)
Alkaline Phosphatase: 94 IU/L (ref 39–117)
Bilirubin Total: 0.3 mg/dL (ref 0.0–1.2)
Bilirubin, Direct: 0.16 mg/dL (ref 0.00–0.40)
Total Protein: 7.5 g/dL (ref 6.0–8.5)

## 2015-06-06 LAB — MICROALBUMIN / CREATININE URINE RATIO
CREATININE, UR: 115.1 mg/dL
MICROALB/CREAT RATIO: 31 mg/g creat — ABNORMAL HIGH (ref 0.0–30.0)
Microalbumin, Urine: 35.7 ug/mL

## 2015-06-06 NOTE — Telephone Encounter (Signed)
Pt dropped off her blood sugar readings. Message in box. 

## 2015-06-09 NOTE — Telephone Encounter (Signed)
I reviewed over the patient's glucose readings. Her numbers are above where we would 1 them to be. I am seeing a trend toward the numbers looking better. I would recommend the following #1 as long as patient is tolerating the metformin I recommend increasing it. Next step 1.5  tablets twice daily(that is 1+ a half twice daily) #2 I recommend patient have appointment with nutritionist for diabetes education #3 keep appointment in February #4 if increased dose of metformin triggers significant diarrhea we will have to try a different dosage and possibly a second medicine finally-send then updated dosing on metformin to her pharmacy

## 2015-06-10 MED ORDER — METFORMIN HCL 500 MG PO TABS: 750.0000 mg | ORAL_TABLET | Freq: Two times a day (BID) | ORAL | Status: DC

## 2015-06-10 NOTE — Addendum Note (Signed)
Addended by: Dairl Ponder on: 06/10/2015 11:59 AM   Modules accepted: Orders

## 2015-06-10 NOTE — Telephone Encounter (Signed)
Discussed with patient. Patient advised dr Nicki Reaper reviewed her numbers and her numbers are above where we would 1 them to be. Dr Nicki Reaper is seeing a trend toward the numbers looking better. Dr Nicki Reaper would recommend the following #1 as long as patient is tolerating the metformin Dr Nicki Reaper recommends increasing it. Next step 1.5 tablets twice daily(that is 1+ a half twice daily) #2Dr Nicki Reaper recommends patient have appointment with nutritionist for diabetes education #3 keep appointment in February #4 if increased dose of metformin triggers significant diarrhea we will have to try a different dosage and possibly a second medicine finally-send then updated dosing on metformin to her pharmacy. Patient verbalized understanding and Rx sent electronically to pharmacy.

## 2015-06-18 ENCOUNTER — Ambulatory Visit (HOSPITAL_COMMUNITY)
Admission: RE | Admit: 2015-06-18 | Discharge: 2015-06-18 | Disposition: A | Discharge status: Home / Self Care | Payer: BLUE CROSS/BLUE SHIELD | Source: Ambulatory Visit | Attending: Family Medicine | Admitting: Family Medicine

## 2015-06-18 DIAGNOSIS — R740 Nonspecific elevation of levels of transaminase and lactic acid dehydrogenase [LDH]: Secondary | ICD-10-CM | POA: Insufficient documentation

## 2015-06-18 DIAGNOSIS — R7989 Other specified abnormal findings of blood chemistry: Secondary | ICD-10-CM | POA: Diagnosis present

## 2015-06-18 DIAGNOSIS — R932 Abnormal findings on diagnostic imaging of liver and biliary tract: Secondary | ICD-10-CM | POA: Insufficient documentation

## 2015-06-18 DIAGNOSIS — N270 Small kidney, unilateral: Secondary | ICD-10-CM | POA: Insufficient documentation

## 2015-06-25 ENCOUNTER — Telehealth: Payer: Self-pay | Admitting: Family Medicine

## 2015-06-25 NOTE — Telephone Encounter (Signed)
Pt dropped off her sugar levels for the month of Dec. Message in box.

## 2015-06-26 LAB — FERRITIN: FERRITIN: 479 ng/mL — AB (ref 15–150)

## 2015-06-26 LAB — HEPATITIS B SURFACE ANTIGEN: HEP B S AG: NEGATIVE

## 2015-06-26 LAB — HEPATITIS C ANTIBODY

## 2015-06-26 LAB — ANA: ANA: NEGATIVE

## 2015-06-26 LAB — CERULOPLASMIN: CERULOPLASMIN: 37.9 mg/dL (ref 19.0–39.0)

## 2015-06-27 ENCOUNTER — Encounter: Payer: Self-pay | Admitting: Family Medicine

## 2015-06-27 ENCOUNTER — Other Ambulatory Visit: Payer: Self-pay

## 2015-06-27 DIAGNOSIS — R748 Abnormal levels of other serum enzymes: Secondary | ICD-10-CM

## 2015-06-27 NOTE — Telephone Encounter (Signed)
Blood sugars look good. Please keep follow-up in February. Watch diet stay active continue medication

## 2015-06-27 NOTE — Telephone Encounter (Signed)
dicsussed with pt

## 2015-07-03 ENCOUNTER — Encounter: Payer: Self-pay | Admitting: Internal Medicine

## 2015-07-24 ENCOUNTER — Ambulatory Visit: Payer: BLUE CROSS/BLUE SHIELD | Admitting: Gastroenterology

## 2015-08-01 ENCOUNTER — Encounter: Payer: Self-pay | Admitting: Family Medicine

## 2015-08-01 ENCOUNTER — Ambulatory Visit (INDEPENDENT_AMBULATORY_CARE_PROVIDER_SITE_OTHER): Payer: BLUE CROSS/BLUE SHIELD | Admitting: Family Medicine

## 2015-08-01 VITALS — BP 132/80 | Ht 66.0 in | Wt 239.0 lb

## 2015-08-01 DIAGNOSIS — I10 Essential (primary) hypertension: Secondary | ICD-10-CM

## 2015-08-01 DIAGNOSIS — Z23 Encounter for immunization: Secondary | ICD-10-CM

## 2015-08-01 DIAGNOSIS — E669 Obesity, unspecified: Secondary | ICD-10-CM

## 2015-08-01 DIAGNOSIS — E785 Hyperlipidemia, unspecified: Secondary | ICD-10-CM | POA: Diagnosis not present

## 2015-08-01 DIAGNOSIS — E119 Type 2 diabetes mellitus without complications: Secondary | ICD-10-CM

## 2015-08-01 LAB — POCT GLYCOSYLATED HEMOGLOBIN (HGB A1C): HEMOGLOBIN A1C: 7.1

## 2015-08-01 NOTE — Patient Instructions (Signed)
Dear Patient,  It has been recommended to you that you have a colonoscopy. It is your responsibility to carry through with this recommendation.   Did you realize that colon cancer is the second leading cancer killer in the [place]. One in every 20 adults will get colon cancer. If all adults would go through the recommended screening for colon cancer (getting a colonoscopy), then there would be a 60% reduction in the number of people dying from colon cancer.  Colon cancer just doesn't come out of the blue. It starts off as a small polyp which over time grows into a cancer. A colonoscopy can prevent cancer and in many cases detected when it is at a very treatable phase. Small colon cancers can have cure rates of 95%. Advanced colon cancer, which often occurs in people who do not do their screenings, have cure rates less than 20%. The risk of colon cancer advances with age. Most adults should have regular colonoscopies every 10 years starting at age 50. This recommendation can vary depending on a person's medical history.  Health-care laws now allow for you to call the gastroenterologist office directly in order to set yourself up for this very important tests. Today we have recommended to you that you do this test. This test may save your life. Failure to do this test puts you at risk for premature death from colon cancer. Do the right thing and schedule this test now.  Here as a list of specialists we recommend in the surrounding area. When you call their office let them know that you are a patient of our practice in your interested in doing a screening colonoscopy. They should assist you without problems. You will need the following information when you called them: 1-name of which Dr. you see, 2-your insurance information, 3-a list of medications that you currently take, 4-any allergies you have to medications.  [place] gastroenterologist Dr. Mike Rourk, Dr Sandi Fields   [place]  gastroenterologist   [phone]  Dr.Najeeb Rehman [facility]   [phone]  [place] [facility] (Dr. Perry, N, Stark, Brodie, Gesner, Jacobs and Pyrtle) [phone]  [facility] (Dr. Buscemi, Edwards, Hayes, Maygod,Outlaw,Schooler) [phone]  Each group of specialists has assured us that when you called them they will help you get your colonoscopy set up. Should you have problems or if the GI practice insist a referral be done please let us know. Be sure to call soon. Sincerely, Carolyn Hoskins, Dr Steve Luking, Dr.Scott Luking    

## 2015-08-01 NOTE — Progress Notes (Signed)
   Subjective:    Patient ID: [name], female    DOB: December 23, 1960, 55 y.o.   MRN: [id number]  Diabetes She presents for her follow-up diabetic visit. She has type 2 diabetes mellitus. Current diabetic treatments: metformin 500 one bid. She is compliant with treatment all of the time. She is following a diabetic diet. She participates in exercise daily. Her overall blood glucose range is 90-110 mg/dl. She does not see a podiatrist.Eye exam is not current.  A1C today 7.1.  Pt states no concerns or problems.  Patient understands importance of taking her medicines watching diet staying physically active A1c under fair control at 7.1 Review of Systems    she denies any chest tightness pressure pain shortness of breath Objective:   Physical Exam Mild obesity patient is working on weight Lungs clear hearts regular Pulse normal. Extremities no edema Diabetic foot exam normal       Assessment & Plan:   hyperlipidemia we will check lab work. May need to be on medication for this Hypertension good control continue current medicines  diabetes good control overall encourage regular excise watching diet losing weight  pneumonia vaccine reduce the risk of pneumonia  Follow-up within 6 months

## 2015-08-03 DIAGNOSIS — E669 Obesity, unspecified: Secondary | ICD-10-CM | POA: Insufficient documentation

## 2015-08-08 ENCOUNTER — Encounter (INDEPENDENT_AMBULATORY_CARE_PROVIDER_SITE_OTHER): Payer: Self-pay

## 2015-08-08 ENCOUNTER — Ambulatory Visit (INDEPENDENT_AMBULATORY_CARE_PROVIDER_SITE_OTHER): Payer: BLUE CROSS/BLUE SHIELD | Admitting: Gastroenterology

## 2015-08-08 ENCOUNTER — Encounter: Payer: Self-pay | Admitting: Gastroenterology

## 2015-08-08 VITALS — BP 144/94 | HR 77 | Temp 97.0°F | Ht 66.0 in | Wt 235.8 lb

## 2015-08-08 DIAGNOSIS — Z1211 Encounter for screening for malignant neoplasm of colon: Secondary | ICD-10-CM

## 2015-08-08 DIAGNOSIS — R7989 Other specified abnormal findings of blood chemistry: Secondary | ICD-10-CM

## 2015-08-08 DIAGNOSIS — R945 Abnormal results of liver function studies: Secondary | ICD-10-CM

## 2015-08-08 DIAGNOSIS — K760 Fatty (change of) liver, not elsewhere classified: Secondary | ICD-10-CM | POA: Diagnosis not present

## 2015-08-08 NOTE — Patient Instructions (Signed)
   We will get a copy of most recent labs. Once reviewed, we will let you know what labs we still need to get.   Keep up great work on exercise and weight loss.  When you are ready for a colonoscopy let us know.    Fatty Liver Fatty liver, also called hepatic steatosis or steatohepatitis, is a condition in which too much fat has built up in your liver cells. The liver removes harmful substances from your bloodstream. It produces fluids your body needs. It also helps your body use and store energy from the food you eat. In many cases, fatty liver does not cause symptoms or problems. It is often diagnosed when tests are being done for other reasons. However, over time, fatty liver can cause inflammation that may lead to more serious liver problems, such as scarring of the liver (cirrhosis). CAUSES  Causes of fatty liver may include:   Drinking too much alcohol.  Poor nutrition.  Obesity.  Cushing syndrome.  Diabetes.  Hyperlipidemia.  Pregnancy.  Certain drugs.  Poisons.  Some viral infections. RISK FACTORS You may be more likely to develop fatty liver if you:  Abuse alcohol.  Are pregnant.  Are overweight.  Have diabetes.  Have hepatitis.  Have a high triglyceride level.  SIGNS AND SYMPTOMS  Fatty liver often does not cause any symptoms. In cases where symptoms develop, they can include:  Fatigue.  Weakness.  Weight loss.  Confusion.   Abdominal pain.  Yellowing of your skin and the white parts of your eyes (jaundice).  Nausea and vomiting. DIAGNOSIS  Fatty liver may be diagnosed by:   Physical exam and medical history.  Blood tests.  Imaging tests, such as an ultrasound, CT scan, or MRI.  Liver biopsy. A small sample of liver tissue is removed using a needle. The sample is then looked at under a microscope. TREATMENT  Fatty liver is often caused by other health conditions. Treatment for fatty liver may involve medicines and lifestyle changes to  manage conditions such as:   Alcoholism.  High cholesterol.  Diabetes.  Being overweight or obese.  HOME CARE INSTRUCTIONS  Eat a healthy diet as directed by your health care provider.  Exercise regularly. This can help you lose weight and control your cholesterol and diabetes. Talk to your health care provider about an exercise plan and which activities are best for you.  Do not drink alcohol.   Take medicines only as directed by your health care provider. SEEK MEDICAL CARE IF: You have difficulty controlling your:  Blood sugar.  Cholesterol.  Alcohol consumption. SEEK IMMEDIATE MEDICAL CARE IF:  You have abdominal pain.  You have jaundice.  You have nausea and vomiting.   This information is not intended to replace advice given to you by your health care provider. Make sure you discuss any questions you have with your health care provider.   Document Released: 07/23/2005 Document Revised: 06/28/2014 Document Reviewed: 10/17/2013 Elsevier Interactive Patient Education [employer].

## 2015-08-08 NOTE — Progress Notes (Signed)
Primary Care Physician:  Sallee Lange, MD  Primary Gastroenterologist:  Garfield Cornea, MD   Chief Complaint  Patient presents with  . elevated liver enzymes    HPI:  [name] is a 55 y.o. female here at the request of Dr. Sallee Lange for further evaluation of elevated LFTs and ferritin. Patient had normal LFTs back in June 2015. October 2016, first noted abnormality of LFTs. Alkaline phosphatase was 159, AST 56, ALT 92 at that time. Labs rechecked in December, alkaline phosphatase 94, AST 48, ALT 83. Patient notes intentional weight loss of 55 pounds. Started on metformin about 3 months ago. Metformin for 3 months.   Overall, patient feels well. She denies any abdominal pain. No heartburn, vomiting, dysphagia. Bowel movements are regular. No blood in the stool or melena. Her fatigue has improved. She has never had a colonoscopy. She has avoided issue for fear of problems with sedation. Recalls having dental work done and developing palpitations related to numbing medication.  Patient's workup thus far, abdominal ultrasound cystoscopy with fatty infiltration. Labs as outlined below with significant finding of ferritin greater than 479.  Current Outpatient Prescriptions  Medication Sig Dispense Refill  . amLODipine-benazepril (LOTREL) 10-20 MG per capsule Take 1 capsule by mouth daily.    . blood glucose meter kit and supplies KIT Dispense based on patient and insurance preference. Use up to test up to once daily. E11.9 1 each 0  . indapamide (LOZOL) 2.5 MG tablet Take 1 tablet (2.5 mg total) by mouth daily. 30 tablet 5  . metFORMIN (GLUCOPHAGE) 500 MG tablet Take 1.5 tablets (750 mg total) by mouth 2 (two) times daily with a meal. 90 tablet 5   No current facility-administered medications for this visit.    Allergies as of 08/08/2015  . (No Known Allergies)    Past Medical History  Diagnosis Date  . Hypertension   . Impaired fasting glucose 09/2011    Past Surgical History   Procedure Laterality Date  . Tubal ligation  1987  . Endometrial ablation      Family History  Problem Relation Age of Onset  . Cancer Father 54    stomach cancer  . Hypertension Father   . Heart disease Maternal Grandmother   . Colon cancer Neg Hx   . Liver disease Mother     fatty liver, age 61    Social History   Social History  . Marital Status: Married    Spouse Name: N/A  . Number of Children: 3  . Years of Education: N/A   Occupational History  . Not on file.   Social History Main Topics  . Smoking status: Never Smoker   . Smokeless tobacco: Never Used  . Alcohol Use: No  . Drug Use: No  . Sexual Activity: Yes    Birth Control/ Protection: Post-menopausal, Surgical   Other Topics Concern  . Not on file   Social History Narrative      ROS:  General: Negative for anorexia, weight loss, fever, chills, fatigue, weakness. Eyes: Negative for vision changes.  ENT: Negative for hoarseness, difficulty swallowing , nasal congestion. CV: Negative for chest pain, angina, palpitations, dyspnea on exertion, peripheral edema.  Respiratory: Negative for dyspnea at rest, dyspnea on exertion, cough, sputum, wheezing.  GI: See history of present illness. GU:  Negative for dysuria, hematuria, urinary incontinence, urinary frequency, nocturnal urination.  MS: Negative for joint pain, low back pain.  Derm: Negative for rash or itching.  Neuro: Negative for  weakness, abnormal sensation, seizure, frequent headaches, memory loss, confusion.  Psych: Negative for anxiety, depression, suicidal ideation, hallucinations.  Endo: Negative for unusual weight change.  Heme: Negative for bruising or bleeding. Allergy: Negative for rash or hives.    Physical Examination:  BP 144/94 mmHg  Pulse 77  Temp(Src) 97 F (36.1 C)  Ht 5' 6" (1.676 m)  Wt 235 lb 12.8 oz (106.958 kg)  BMI 38.08 kg/m2   General: Well-nourished, well-developed in no acute distress.  Head:  Normocephalic, atraumatic.   Eyes: Conjunctiva pink, no icterus. Mouth: Oropharyngeal mucosa moist and pink , no lesions erythema or exudate. Neck: Supple without thyromegaly, masses, or lymphadenopathy.  Lungs: Clear to auscultation bilaterally.  Heart: Regular rate and rhythm, no murmurs rubs or gallops.  Abdomen: Bowel sounds are normal, nontender, nondistended, no hepatosplenomegaly or masses, no abdominal bruits or    hernia , no rebound or guarding.   Rectal: not performed Extremities: No lower extremity edema. No clubbing or deformities.  Neuro: Alert and oriented x 4 , grossly normal neurologically.  Skin: Warm and dry, no rash or jaundice.   Psych: Alert and cooperative, normal mood and affect.  Labs: Lab Results  Component Value Date   FERRITIN 479* 06/25/2015   Lab Results  Component Value Date   ANA Negative 06/25/2015   Lab Results  Component Value Date   ALT 83* 06/05/2015   AST 48* 06/05/2015   ALKPHOS 94 06/05/2015   BILITOT 0.3 06/05/2015   No results found for: CREATININE, BUN, NA, K, CL, CO2 No results found for: WBC, HGB, HCT, MCV, PLT  Ceruloplasmin: 37.9 normal. Hepatitis B surface antigen negative Hepatitis C antibody negative Imaging Studies:   CLINICAL DATA: Elevated liver function test.  EXAM: ABDOMEN ULTRASOUND COMPLETE  COMPARISON: Ultrasound 06/ 28/2007.  FINDINGS: Gallbladder: No gallstones or wall thickening visualized. No sonographic Murphy sign noted by sonographer.  Common bile duct: Diameter: 3.6 mm  Liver: Liver is echogenic consistent with fatty infiltration and/or hepatocellular disease. No focal hepatic abnormality.  IVC: No abnormality visualized.  Pancreas: Visualized portion unremarkable.  Spleen: Size and appearance within normal limits.  Right Kidney: Length: 9.9 cm. Mild cortical thinning. Echogenicity within normal limits. No mass or hydronephrosis visualized.  Left Kidney: Length: 13.1 cm.  Echogenicity within normal limits. No mass or hydronephrosis visualized.  Abdominal aorta: No aneurysm visualized.  Other findings: None.  IMPRESSION: 1. Liver is echogenic consistent fatty infiltration and/or hepatocellular disease. No focal hepatic abnormality. 2. Slightly small right kidney with mild right renal cortical thinning consistent with atrophy .   Electronically Signed  By: Marcello Moores Register  On: 06/18/2015 08:42

## 2015-08-13 ENCOUNTER — Encounter: Payer: Self-pay | Admitting: Gastroenterology

## 2015-08-13 DIAGNOSIS — Z1211 Encounter for screening for malignant neoplasm of colon: Secondary | ICD-10-CM | POA: Insufficient documentation

## 2015-08-13 NOTE — Progress Notes (Signed)
Received records from recent labs dated 08/05/2015. Total cholesterol 187, HDL 60, LDL 103, glucose 85, creatinine 1.09, BUN 26, total bilirubin 0.4, alkaline phosphatase 80, AST 25, ALT 39, albumin 4.7.  Please let patient know that we still need the following labs. Fasting iron/TIBC Fasting ferritin Hemachromatosis genetics TTG IgA level

## 2015-08-13 NOTE — Assessment & Plan Note (Signed)
Recommend colonoscopy whenever the patient is ready. Reassured her regarding her concerns for sedation. She will let us know when she is ready.

## 2015-08-13 NOTE — Addendum Note (Signed)
Addended by: Mahala Menghini on: 08/13/2015 08:53 AM   Modules accepted: Orders

## 2015-08-13 NOTE — Progress Notes (Signed)
CC'ED TO PCP 

## 2015-08-13 NOTE — Assessment & Plan Note (Signed)
55 year old female with abnormal LFTs dating back to at least October 2016, recent diagnosis of diabetes. Ultrasound most consistent with fatty liver. Screening for Wilson's disease, viral hepatitis unremarkable. Ferritin elevated, unclear significance at this time, will need to have further investigation to rule out hemachromatosis. Patient recently had blood work done at her job. She requested that we retrieve those records before adding on any other labs. Once they're obtained we will continue workup appropriately.  Instructions for fatty liver: Recommend 1-2# weight loss per week until ideal body weight through exercise & diet. Low fat/cholesterol diet.   Avoid sweets, sodas, fruit juices, sweetened beverages like tea, etc. Gradually increase exercise from 15 min daily up to 1 hr per day 5 days/week. Limit alcohol use.

## 2015-08-15 NOTE — Progress Notes (Signed)
Tried to call pt- LMOM 

## 2015-08-19 NOTE — Progress Notes (Signed)
Letter and lab orders mailed to the pt.  

## 2015-08-19 NOTE — Progress Notes (Signed)
Tried to call pt- NA-LM 

## 2015-08-21 ENCOUNTER — Telehealth: Payer: Self-pay | Admitting: Internal Medicine

## 2015-08-21 NOTE — Telephone Encounter (Signed)
Pt called to let us know that she wants to have her labs done at [facility]

## 2015-08-25 ENCOUNTER — Other Ambulatory Visit: Payer: Self-pay | Admitting: Family Medicine

## 2015-08-26 NOTE — Telephone Encounter (Signed)
That's fine. The orders that were given to her can be taken to [facility].

## 2015-09-08 ENCOUNTER — Other Ambulatory Visit: Payer: Self-pay

## 2015-09-08 DIAGNOSIS — K760 Fatty (change of) liver, not elsewhere classified: Secondary | ICD-10-CM

## 2015-09-08 DIAGNOSIS — R790 Abnormal level of blood mineral: Secondary | ICD-10-CM

## 2015-09-08 DIAGNOSIS — Z1211 Encounter for screening for malignant neoplasm of colon: Secondary | ICD-10-CM

## 2015-09-08 DIAGNOSIS — R7989 Other specified abnormal findings of blood chemistry: Secondary | ICD-10-CM

## 2015-09-08 NOTE — Telephone Encounter (Signed)
T/C from [facility], [place], said pt was there to get labs and no lab orders in epic. I told her pt should have the orders with her, but pt said she did not have them. I printed the labs and faxed to [facility] at [phone].

## 2015-09-08 NOTE — Telephone Encounter (Signed)
Loma [place] called back, she could not get the orders in. I have put the orders in as [facility] and faxed them to her.

## 2015-09-08 NOTE — Telephone Encounter (Signed)
noted 

## 2015-09-09 ENCOUNTER — Telehealth: Payer: Self-pay

## 2015-09-09 NOTE — Telephone Encounter (Signed)
Lab results from [place] placed on LSL desk

## 2015-09-12 LAB — IRON AND TIBC
Iron Saturation: 20 % (ref 15–55)
Iron: 71 ug/dL (ref 27–159)
Total Iron Binding Capacity: 348 ug/dL (ref 250–450)
UIBC: 277 ug/dL (ref 131–425)

## 2015-09-12 LAB — FERRITIN: FERRITIN: 337 ng/mL — AB (ref 15–150)

## 2015-09-12 LAB — IGA: IGA/IMMUNOGLOBULIN A, SERUM: 131 mg/dL (ref 87–352)

## 2015-09-12 LAB — TISSUE TRANSGLUTAMINASE, IGA

## 2015-09-12 LAB — HEMOCHROMATOSIS DNA-PCR(C282Y,H63D)

## 2015-09-18 ENCOUNTER — Telehealth: Payer: Self-pay | Admitting: Internal Medicine

## 2015-09-18 NOTE — Progress Notes (Signed)
Quick Note:  Please let patient know her labs overall are good. Her screen for iron overload/hemochromatosis, celiac disease were all negative.  Her ferritin remains high (but improved) that labs is nonspecific and can be seen with any type of acute illness or inflammation. Her last LFTs were much better.  I suspect fatty liver is culprit.   We will recheck LFTs and ferritin in 3 months.  Instructions for fatty liver: Recommend 1-2# weight loss per week until ideal body weight through exercise & diet. Low fat/cholesterol diet.  Avoid sweets, sodas, fruit juices, sweetened beverages like tea, etc. Gradually increase exercise from 15 min daily up to 1 hr per day 5 days/week. Limit alcohol use.   ______

## 2015-09-18 NOTE — Telephone Encounter (Signed)
See result note.  

## 2015-09-18 NOTE — Telephone Encounter (Signed)
PLEASE CALL PATIENT IF LABS ARE BACK FROM LAST WEEK

## 2015-09-18 NOTE — Telephone Encounter (Signed)
Routing to [facility] 

## 2015-09-19 NOTE — Telephone Encounter (Signed)
Called pt- NA-LMOM with results and recommendations. Letter mailed to her with recommendations.

## 2015-09-22 ENCOUNTER — Other Ambulatory Visit: Payer: Self-pay | Admitting: Gastroenterology

## 2015-09-22 DIAGNOSIS — R945 Abnormal results of liver function studies: Secondary | ICD-10-CM

## 2015-09-22 DIAGNOSIS — K760 Fatty (change of) liver, not elsewhere classified: Secondary | ICD-10-CM

## 2015-09-22 DIAGNOSIS — R7989 Other specified abnormal findings of blood chemistry: Secondary | ICD-10-CM

## 2015-09-30 DIAGNOSIS — E669 Obesity, unspecified: Secondary | ICD-10-CM | POA: Diagnosis not present

## 2015-09-30 DIAGNOSIS — Z6835 Body mass index (BMI) 35.0-35.9, adult: Secondary | ICD-10-CM | POA: Diagnosis not present

## 2015-10-14 DIAGNOSIS — E669 Obesity, unspecified: Secondary | ICD-10-CM | POA: Diagnosis not present

## 2015-10-14 DIAGNOSIS — Z6834 Body mass index (BMI) 34.0-34.9, adult: Secondary | ICD-10-CM | POA: Diagnosis not present

## 2015-11-10 ENCOUNTER — Other Ambulatory Visit: Payer: Self-pay

## 2015-11-10 DIAGNOSIS — K760 Fatty (change of) liver, not elsewhere classified: Secondary | ICD-10-CM

## 2015-11-10 DIAGNOSIS — R7989 Other specified abnormal findings of blood chemistry: Secondary | ICD-10-CM

## 2015-11-10 DIAGNOSIS — R945 Abnormal results of liver function studies: Secondary | ICD-10-CM

## 2015-11-11 DIAGNOSIS — E559 Vitamin D deficiency, unspecified: Secondary | ICD-10-CM | POA: Diagnosis not present

## 2015-11-11 DIAGNOSIS — E782 Mixed hyperlipidemia: Secondary | ICD-10-CM | POA: Diagnosis not present

## 2015-11-11 DIAGNOSIS — E119 Type 2 diabetes mellitus without complications: Secondary | ICD-10-CM | POA: Diagnosis not present

## 2015-11-11 DIAGNOSIS — Z79899 Other long term (current) drug therapy: Secondary | ICD-10-CM | POA: Diagnosis not present

## 2015-11-11 DIAGNOSIS — E669 Obesity, unspecified: Secondary | ICD-10-CM | POA: Diagnosis not present

## 2015-11-11 DIAGNOSIS — Z6834 Body mass index (BMI) 34.0-34.9, adult: Secondary | ICD-10-CM | POA: Diagnosis not present

## 2015-11-13 ENCOUNTER — Other Ambulatory Visit: Payer: Self-pay | Admitting: Family Medicine

## 2015-11-13 DIAGNOSIS — Z1231 Encounter for screening mammogram for malignant neoplasm of breast: Secondary | ICD-10-CM

## 2015-11-21 ENCOUNTER — Ambulatory Visit (HOSPITAL_COMMUNITY)
Admission: RE | Admit: 2015-11-21 | Discharge: 2015-11-21 | Disposition: A | Discharge status: Home / Self Care | Payer: BLUE CROSS/BLUE SHIELD | Source: Ambulatory Visit | Attending: Family Medicine | Admitting: Family Medicine

## 2015-11-21 DIAGNOSIS — Z1231 Encounter for screening mammogram for malignant neoplasm of breast: Secondary | ICD-10-CM | POA: Diagnosis not present

## 2015-11-25 DIAGNOSIS — E782 Mixed hyperlipidemia: Secondary | ICD-10-CM | POA: Diagnosis not present

## 2015-11-25 DIAGNOSIS — I10 Essential (primary) hypertension: Secondary | ICD-10-CM | POA: Diagnosis not present

## 2015-11-25 DIAGNOSIS — E669 Obesity, unspecified: Secondary | ICD-10-CM | POA: Diagnosis not present

## 2015-11-25 DIAGNOSIS — E119 Type 2 diabetes mellitus without complications: Secondary | ICD-10-CM | POA: Diagnosis not present

## 2015-12-09 DIAGNOSIS — R079 Chest pain, unspecified: Secondary | ICD-10-CM | POA: Diagnosis not present

## 2015-12-09 DIAGNOSIS — Z6834 Body mass index (BMI) 34.0-34.9, adult: Secondary | ICD-10-CM | POA: Diagnosis not present

## 2015-12-09 DIAGNOSIS — E669 Obesity, unspecified: Secondary | ICD-10-CM | POA: Diagnosis not present

## 2016-01-06 DIAGNOSIS — E782 Mixed hyperlipidemia: Secondary | ICD-10-CM | POA: Diagnosis not present

## 2016-01-06 DIAGNOSIS — I10 Essential (primary) hypertension: Secondary | ICD-10-CM | POA: Diagnosis not present

## 2016-01-06 DIAGNOSIS — E119 Type 2 diabetes mellitus without complications: Secondary | ICD-10-CM | POA: Diagnosis not present

## 2016-01-06 DIAGNOSIS — E669 Obesity, unspecified: Secondary | ICD-10-CM | POA: Diagnosis not present

## 2016-01-20 DIAGNOSIS — Z6834 Body mass index (BMI) 34.0-34.9, adult: Secondary | ICD-10-CM | POA: Diagnosis not present

## 2016-01-20 DIAGNOSIS — E669 Obesity, unspecified: Secondary | ICD-10-CM | POA: Diagnosis not present

## 2016-02-10 DIAGNOSIS — E669 Obesity, unspecified: Secondary | ICD-10-CM | POA: Diagnosis not present

## 2016-02-10 DIAGNOSIS — Z6834 Body mass index (BMI) 34.0-34.9, adult: Secondary | ICD-10-CM | POA: Diagnosis not present

## 2016-02-24 DIAGNOSIS — E669 Obesity, unspecified: Secondary | ICD-10-CM | POA: Diagnosis not present

## 2016-02-24 DIAGNOSIS — Z6834 Body mass index (BMI) 34.0-34.9, adult: Secondary | ICD-10-CM | POA: Diagnosis not present

## 2016-03-31 DIAGNOSIS — Z23 Encounter for immunization: Secondary | ICD-10-CM | POA: Diagnosis not present

## 2016-04-20 ENCOUNTER — Telehealth: Payer: Self-pay | Admitting: Family Medicine

## 2016-04-20 DIAGNOSIS — I10 Essential (primary) hypertension: Secondary | ICD-10-CM | POA: Diagnosis not present

## 2016-04-20 DIAGNOSIS — E782 Mixed hyperlipidemia: Secondary | ICD-10-CM | POA: Diagnosis not present

## 2016-04-20 DIAGNOSIS — E669 Obesity, unspecified: Secondary | ICD-10-CM | POA: Diagnosis not present

## 2016-04-20 DIAGNOSIS — E119 Type 2 diabetes mellitus without complications: Secondary | ICD-10-CM | POA: Diagnosis not present

## 2016-04-20 NOTE — Telephone Encounter (Signed)
Review blood work results in red folder. °

## 2016-04-27 ENCOUNTER — Encounter: Payer: Self-pay | Admitting: Family Medicine

## 2016-05-11 DIAGNOSIS — E119 Type 2 diabetes mellitus without complications: Secondary | ICD-10-CM | POA: Diagnosis not present

## 2016-05-11 DIAGNOSIS — Z79899 Other long term (current) drug therapy: Secondary | ICD-10-CM | POA: Diagnosis not present

## 2016-05-11 DIAGNOSIS — E559 Vitamin D deficiency, unspecified: Secondary | ICD-10-CM | POA: Diagnosis not present

## 2016-05-11 DIAGNOSIS — Z139 Encounter for screening, unspecified: Secondary | ICD-10-CM | POA: Diagnosis not present

## 2016-05-17 NOTE — Telephone Encounter (Signed)
Scanned into the system

## 2016-05-18 DIAGNOSIS — E559 Vitamin D deficiency, unspecified: Secondary | ICD-10-CM | POA: Diagnosis not present

## 2016-05-18 DIAGNOSIS — I10 Essential (primary) hypertension: Secondary | ICD-10-CM | POA: Diagnosis not present

## 2016-05-18 DIAGNOSIS — E119 Type 2 diabetes mellitus without complications: Secondary | ICD-10-CM | POA: Diagnosis not present

## 2016-05-19 ENCOUNTER — Telehealth: Payer: Self-pay | Admitting: Family Medicine

## 2016-05-19 NOTE — Telephone Encounter (Signed)
Review blood work results from patients work.

## 2016-05-23 NOTE — Telephone Encounter (Signed)
Nurse's-this patient does need a follow-up office visit please send her a note recommending follow-up office visit by February.. Lab work will be scanned into the system. Liver functions look good kidney function looks good hemoglobin good at 12.2 thyroid function good A1c 5.6 LDL 117 HDL 66

## 2016-05-24 NOTE — Telephone Encounter (Signed)
Left message on voicemail to return call.

## 2016-05-27 NOTE — Telephone Encounter (Signed)
Spoke with patient and informed her per Dr.Scott Luking- Liver functions look good kidney function looks good hemoglobin good at 12.2 thyroid function good A1c 5.6 LDL 117 HDL 66. Needs office visit by February. Patient verbalized understanding.

## 2016-08-10 ENCOUNTER — Encounter: Payer: Self-pay | Admitting: Family Medicine

## 2016-08-10 ENCOUNTER — Ambulatory Visit (INDEPENDENT_AMBULATORY_CARE_PROVIDER_SITE_OTHER): Payer: BLUE CROSS/BLUE SHIELD | Admitting: Family Medicine

## 2016-08-10 VITALS — BP 126/84 | Ht 66.0 in | Wt 225.4 lb

## 2016-08-10 DIAGNOSIS — I10 Essential (primary) hypertension: Secondary | ICD-10-CM

## 2016-08-10 DIAGNOSIS — E7849 Other hyperlipidemia: Secondary | ICD-10-CM

## 2016-08-10 DIAGNOSIS — R7989 Other specified abnormal findings of blood chemistry: Secondary | ICD-10-CM | POA: Diagnosis not present

## 2016-08-10 DIAGNOSIS — Z1211 Encounter for screening for malignant neoplasm of colon: Secondary | ICD-10-CM

## 2016-08-10 DIAGNOSIS — E784 Other hyperlipidemia: Secondary | ICD-10-CM | POA: Diagnosis not present

## 2016-08-10 DIAGNOSIS — E119 Type 2 diabetes mellitus without complications: Secondary | ICD-10-CM | POA: Diagnosis not present

## 2016-08-10 LAB — POCT GLYCOSYLATED HEMOGLOBIN (HGB A1C): Hemoglobin A1C: 5

## 2016-08-10 NOTE — Progress Notes (Signed)
   Subjective:    Patient ID: [name], female    DOB: 31-Dec-1960, 56 y.o.   MRN: [id number]  Diabetes  She presents for her follow-up diabetic visit. She has type 2 diabetes mellitus. Pertinent negatives for hypoglycemia include no headaches. Pertinent negatives for diabetes include no chest pain and no fatigue. Risk factors for coronary artery disease include hypertension. Current diabetic treatment includes oral agent (monotherapy). She is compliant with treatment all of the time. Her weight is stable. She is following a diabetic diet. She has not had a previous visit with a dietitian. She does not see a podiatrist.Eye exam is not current.   Patient's blood pressure is under good control with medication she is not quite sure what medicine she is taking  She states she is getting her medications from her nurse practitioner at work who sees her every 3 months and does lab work on her. Her LDL slightly elevated but the A1c looks under good control creatinine looks under good control she states she is staying up-to-date on other health issues   Review of Systems  Constitutional: Negative for activity change, fatigue and fever.  Respiratory: Negative for cough and shortness of breath.   Cardiovascular: Negative for chest pain and leg swelling.  Gastrointestinal: Negative for abdominal pain and blood in stool.  Neurological: Negative for headaches.       Objective:   Physical Exam  Constitutional: She appears well-nourished. No distress.  Cardiovascular: Normal rate, regular rhythm and normal heart sounds.   No murmur heard. Pulmonary/Chest: Effort normal and breath sounds normal. No respiratory distress.  Musculoskeletal: She exhibits no edema.  Lymphadenopathy:    She has no cervical adenopathy.  Neurological: She is alert. She exhibits normal muscle tone.  Psychiatric: Her behavior is normal.  Vitals reviewed.         Assessment & Plan:  Patient has had a history of  elevated ferritin in the past we will check lab work to look at this  Her A1c overall looks good under good control she is taking metformin once daily  Her blood pressure under good control but she is uncertain which medicine she is on she will let us know  I encouraged her to follow-up here somewhere in the next 8-10 months it seems that she is under good control with her nurse practitioner every 3 months.  I do recommend referral to gastroenterology for colonoscopy

## 2016-08-11 ENCOUNTER — Encounter: Payer: Self-pay | Admitting: Family Medicine

## 2016-08-11 LAB — CBC WITH DIFFERENTIAL/PLATELET
BASOS: 1 %
Basophils Absolute: 0.1 10*3/uL (ref 0.0–0.2)
EOS (ABSOLUTE): 0.1 10*3/uL (ref 0.0–0.4)
EOS: 3 %
Hematocrit: 36.3 % (ref 34.0–46.6)
Hemoglobin: 12.4 g/dL (ref 11.1–15.9)
IMMATURE GRANS (ABS): 0 10*3/uL (ref 0.0–0.1)
IMMATURE GRANULOCYTES: 0 %
LYMPHS: 38 %
Lymphocytes Absolute: 1.6 10*3/uL (ref 0.7–3.1)
MCH: 28.9 pg (ref 26.6–33.0)
MCHC: 34.2 g/dL (ref 31.5–35.7)
MCV: 85 fL (ref 79–97)
MONOS ABS: 0.4 10*3/uL (ref 0.1–0.9)
Monocytes: 10 %
NEUTROS ABS: 2.1 10*3/uL (ref 1.4–7.0)
Neutrophils: 48 %
PLATELETS: 265 10*3/uL (ref 150–379)
RBC: 4.29 x10E6/uL (ref 3.77–5.28)
RDW: 14.7 % (ref 12.3–15.4)
WBC: 4.3 10*3/uL (ref 3.4–10.8)

## 2016-08-11 LAB — FERRITIN: Ferritin: 259 ng/mL — ABNORMAL HIGH (ref 15–150)

## 2016-08-11 LAB — IRON: Iron: 79 ug/dL (ref 27–159)

## 2016-08-11 NOTE — Progress Notes (Signed)
Referral ordered in EPIC. 

## 2016-08-11 NOTE — Addendum Note (Signed)
Addended by: Dairl Ponder on: 08/11/2016 08:17 AM   Modules accepted: Orders

## 2016-08-12 ENCOUNTER — Other Ambulatory Visit: Payer: Self-pay | Admitting: *Deleted

## 2016-08-12 DIAGNOSIS — R7989 Other specified abnormal findings of blood chemistry: Secondary | ICD-10-CM

## 2016-08-12 DIAGNOSIS — Z1211 Encounter for screening for malignant neoplasm of colon: Secondary | ICD-10-CM

## 2016-08-12 NOTE — Progress Notes (Signed)
FORWARDED TO PROVIDER

## 2016-08-19 LAB — SPECIMEN STATUS REPORT

## 2016-08-21 LAB — IRON AND TIBC
Iron Saturation: 26 % (ref 15–55)
Iron: 77 ug/dL (ref 27–159)
Total Iron Binding Capacity: 297 ug/dL (ref 250–450)
UIBC: 220 ug/dL (ref 131–425)

## 2016-08-21 LAB — SPECIMEN STATUS REPORT

## 2016-08-24 DIAGNOSIS — E119 Type 2 diabetes mellitus without complications: Secondary | ICD-10-CM | POA: Diagnosis not present

## 2016-08-24 DIAGNOSIS — E669 Obesity, unspecified: Secondary | ICD-10-CM | POA: Diagnosis not present

## 2016-08-24 DIAGNOSIS — E559 Vitamin D deficiency, unspecified: Secondary | ICD-10-CM | POA: Diagnosis not present

## 2016-08-24 DIAGNOSIS — Z6834 Body mass index (BMI) 34.0-34.9, adult: Secondary | ICD-10-CM | POA: Diagnosis not present

## 2016-08-24 DIAGNOSIS — Z008 Encounter for other general examination: Secondary | ICD-10-CM | POA: Diagnosis not present

## 2016-08-24 DIAGNOSIS — Z1389 Encounter for screening for other disorder: Secondary | ICD-10-CM | POA: Diagnosis not present

## 2016-09-09 ENCOUNTER — Telehealth: Payer: Self-pay

## 2016-09-09 NOTE — Telephone Encounter (Signed)
Pt left Vm she is ready to schedule. I called and LMOM for a return call.

## 2016-09-16 NOTE — Progress Notes (Signed)
LMOM to call for OV appt.

## 2016-09-16 NOTE — Progress Notes (Signed)
Patient needs OV for elevated ferritin per referral.

## 2016-09-16 NOTE — Telephone Encounter (Signed)
LMOM to call for OV appt.

## 2016-09-20 ENCOUNTER — Encounter: Payer: Self-pay | Admitting: Gastroenterology

## 2016-09-20 NOTE — Progress Notes (Signed)
Pt has OV with Neil Crouch, PA on 10/18/2016 at 8:30 Am.

## 2016-09-20 NOTE — Progress Notes (Signed)
APPT MADE AND LETTER SENT  °

## 2016-09-27 NOTE — Telephone Encounter (Signed)
Pt is scheduled to see Neil Crouch, PA on 10/18/2016 at 8:30 am for elevated ferritin.

## 2016-10-18 ENCOUNTER — Other Ambulatory Visit: Payer: Self-pay | Admitting: Gastroenterology

## 2016-10-18 ENCOUNTER — Telehealth: Payer: Self-pay | Admitting: Gastroenterology

## 2016-10-18 ENCOUNTER — Encounter: Payer: Self-pay | Admitting: Internal Medicine

## 2016-10-18 ENCOUNTER — Telehealth: Payer: Self-pay

## 2016-10-18 ENCOUNTER — Ambulatory Visit (INDEPENDENT_AMBULATORY_CARE_PROVIDER_SITE_OTHER): Payer: BLUE CROSS/BLUE SHIELD | Admitting: Gastroenterology

## 2016-10-18 ENCOUNTER — Other Ambulatory Visit: Payer: Self-pay

## 2016-10-18 ENCOUNTER — Encounter: Payer: Self-pay | Admitting: Gastroenterology

## 2016-10-18 VITALS — BP 162/93 | HR 71 | Temp 97.8°F | Ht 66.0 in | Wt 222.8 lb

## 2016-10-18 DIAGNOSIS — R7989 Other specified abnormal findings of blood chemistry: Secondary | ICD-10-CM

## 2016-10-18 DIAGNOSIS — Z1211 Encounter for screening for malignant neoplasm of colon: Secondary | ICD-10-CM

## 2016-10-18 DIAGNOSIS — K760 Fatty (change of) liver, not elsewhere classified: Secondary | ICD-10-CM

## 2016-10-18 DIAGNOSIS — R945 Abnormal results of liver function studies: Secondary | ICD-10-CM

## 2016-10-18 MED ORDER — PEG 3350-KCL-NA BICARB-NACL 420 G PO SOLR: 4000.0000 mL | ORAL | 0 refills | Status: DC

## 2016-10-18 NOTE — Patient Instructions (Signed)
1. We will repeat your labs in August 2018. We will contact you with information closer to that date. 2. Continue with your weight loss efforts, daily exercise. This will help reduce the fat in your liver. 3. Colonoscopy as scheduled. Please see separate instructions.   Fatty Liver Fatty liver, also called hepatic steatosis or steatohepatitis, is a condition in which too much fat has built up in your liver cells. The liver removes harmful substances from your bloodstream. It produces fluids your body needs. It also helps your body use and store energy from the food you eat. In many cases, fatty liver does not cause symptoms or problems. It is often diagnosed when tests are being done for other reasons. However, over time, fatty liver can cause inflammation that may lead to more serious liver problems, such as scarring of the liver (cirrhosis). What are the causes? Causes of fatty liver may include:  Drinking too much alcohol.  Poor nutrition.  Obesity.  Cushing syndrome.  Diabetes.  Hyperlipidemia.  Pregnancy.  Certain drugs.  Poisons.  Some viral infections. What increases the risk? You may be more likely to develop fatty liver if you:  Abuse alcohol.  Are pregnant.  Are overweight.  Have diabetes.  Have hepatitis.  Have a high triglyceride level. What are the signs or symptoms? Fatty liver often does not cause any symptoms. In cases where symptoms develop, they can include:  Fatigue.  Weakness.  Weight loss.  Confusion.  Abdominal pain.  Yellowing of your skin and the white parts of your eyes (jaundice).  Nausea and vomiting. How is this diagnosed? Fatty liver may be diagnosed by:  Physical exam and medical history.  Blood tests.  Imaging tests, such as an ultrasound, CT scan, or MRI.  Liver biopsy. A small sample of liver tissue is removed using a needle. The sample is then looked at under a microscope. How is this treated? Fatty liver is  often caused by other health conditions. Treatment for fatty liver may involve medicines and lifestyle changes to manage conditions such as:  Alcoholism.  High cholesterol.  Diabetes.  Being overweight or obese. Follow these instructions at home:  Eat a healthy diet as directed by your health care provider.  Exercise regularly. This can help you lose weight and control your cholesterol and diabetes. Talk to your health care provider about an exercise plan and which activities are best for you.  Do not drink alcohol.  Take medicines only as directed by your health care provider. Contact a health care provider if: You have difficulty controlling your:  Blood sugar.  Cholesterol.  Alcohol consumption. Get help right away if:  You have abdominal pain.  You have jaundice.  You have nausea and vomiting. This information is not intended to replace advice given to you by your health care provider. Make sure you discuss any questions you have with your health care provider. Document Released: 07/23/2005 Document Revised: 11/13/2015 Document Reviewed: 10/17/2013 Elsevier Interactive Patient Education  2017 [employer]  Nonalcoholic Fatty Liver Disease Diet Nonalcoholic fatty liver disease is a condition that causes fat to accumulate in and around the liver. The disease makes it harder for the liver to work the way that it should. Following a healthy diet can help to keep nonalcoholic fatty liver disease under control. It can also help to prevent or improve conditions that are associated with the disease, such as heart disease, diabetes, high blood pressure, and abnormal cholesterol levels. Along with regular exercise, this diet:  Promotes weight loss.  Helps to control blood sugar levels.  Helps to improve the way that the body uses insulin. What do I need to know about this diet?  Use the glycemic index (GI) to plan your meals. The index tells you how quickly a food will  raise your blood sugar. Choose low-GI foods. These foods take a longer time to raise blood sugar.  Keep track of how many calories you take in. Eating the right amount of calories will help you to achieve a healthy weight.  You may want to follow a Mediterranean diet. This diet includes a lot of vegetables, lean meats or fish, whole grains, fruits, and healthy oils and fats. What foods can I eat? Grains  Whole grains, such as whole-wheat or whole-grain breads, crackers, tortillas, cereals, and pasta. Stone-ground whole wheat. Pumpernickel bread. Unsweetened oatmeal. Bulgur. Barley. Quinoa. Brown or wild rice. Corn or whole-wheat flour tortillas. Vegetables  Lettuce. Spinach. Peas. Beets. Cauliflower. Cabbage. Broccoli. Carrots. Tomatoes. Squash. Eggplant. Herbs. Peppers. Onions. Cucumbers. Brussels sprouts. Yams and sweet potatoes. Beans. Lentils. Fruits  Bananas. Apples. Oranges. Grapes. Papaya. Mango. Pomegranate. Kiwi. Grapefruit. Cherries. Meats and Other Protein Sources  Seafood and shellfish. Lean meats. Poultry. Tofu. Dairy  Low-fat or fat-free dairy products, such as yogurt, cottage cheese, and cheese. Beverages  Water. Sugar-free drinks. Tea. Coffee. Low-fat or skim milk. Milk alternatives, such as soy or almond milk. Real fruit juice. Condiments  Mustard. Relish. Low-fat, low-sugar ketchup and barbecue sauce. Low-fat or fat-free mayonnaise. Sweets and Desserts  Sugar-free sweets. Fats and Oils  Avocado. Canola or olive oil. Nuts and nut butters. Seeds. The items listed above may not be a complete list of recommended foods or beverages. Contact your dietitian for more options.  What foods are not recommended? Palm oil and coconut oil. Processed foods. Fried foods. Sweetened drinks, such as sweet tea, milkshakes, snow cones, iced sweet drinks, and sodas. Alcohol. Sweets. Foods that contain a lot of salt or sodium. The items listed above may not be a complete list of foods and  beverages to avoid. Contact your dietitian for more information.  This information is not intended to replace advice given to you by your health care provider. Make sure you discuss any questions you have with your health care provider. Document Released: 10/22/2014 Document Revised: 11/13/2015 Document Reviewed: 07/02/2014 Elsevier Interactive Patient Education  2017 [employer].

## 2016-10-18 NOTE — Telephone Encounter (Signed)
MADE PATIENT A FOLLOW UP APPOINTMENT AND MAILED LETTER

## 2016-10-18 NOTE — Assessment & Plan Note (Signed)
Colonoscopy with Dr. Gala Romney in the near future. Patient has significant anxiety about sedation and therefore we will plan on deep sedation in the OR.  I have discussed the risks, alternatives, benefits with regards to but not limited to the risk of reaction to medication, bleeding, infection, perforation and the patient is agreeable to proceed. Written consent to be obtained.

## 2016-10-18 NOTE — Progress Notes (Signed)
cc'ed to pcp °

## 2016-10-18 NOTE — Telephone Encounter (Signed)
Tried to call pt to inform of pre-op appt 11/22/16 at 12:45pm, no answer. LMOVM and informed her. Letter also mailed.

## 2016-10-18 NOTE — Telephone Encounter (Signed)
Patient needs f/u OV in 01/2017, Dx: fatty liver, elevated ferritin.

## 2016-10-18 NOTE — Assessment & Plan Note (Signed)
Pleasant 56 year old lady with history of fatty liver based on ultrasound findings, previously elevated AST/ALT, elevated ferritin. Extensive workup last year revealed negative hepatitis B and C markers, negative hemochromatosis DNA testing, normal ANA, negative celiac screen, normal ceruloplasmin. Patient has lost significant amount of weight with dietary changes and daily exercise. She has lost over 50 pounds since the latter part of 2016. Her hemoglobin A1c is 5. Her total cholesterol and LDL are essentially normal. Her ferritin has declined from 479 in January 2017 down to 259 back in February. Her LFTs have normalized.  Long discussion with patient today, she needs to continue her dietary changes, daily exercise, tight control of her diabetes as this will be the most significant thing to improve her fatty liver. Reassuringly her ferritin has improved by nearly 50% in the past one year. Her AST/ALT have normalized. Suspect elevated ferritin related to fatty liver. There is no indication of iron overload. We will continue to follow her closely. Repeat ferritin, iron and TIBC in August 2018. Patient voiced understanding.

## 2016-10-18 NOTE — Progress Notes (Signed)
Primary Care Physician: Sallee Lange, MD  Primary Gastroenterologist:  Garfield Cornea, MD   Chief Complaint  Patient presents with  . elevated ferritin    HPI: [name] is a 56 y.o. female here for follow-up of elevated ferritin and to have colonoscopy done. She was last seen in February 2017. Ferritin greater than 479 at one point. Previous hep B surface antigen and hepatitis C antibody negative. Previous normal ceruloplasmin. Last ultrasound of the liver was in 2016 consistent with fatty liver. 2017 she had a normal ANA, celiac screen was negative, hemochromatosis DNA testing negative for hereditary hemochromatosis. as well.  February 2018 ferritin still abnormal but much improved to 259. Iron and TIBC iron saturations unremarkable. CBC unremarkable.LFTs normal. Patient has lost more than 50 pounds since latter part of 2016.  Clinically she feels well. Denies abdominal pain, vomiting, heartburn, dysphagia, constipation, diarrhea, melena, rectal bleeding. She tells me she is ready to schedule colonoscopy but continues to be concerned about adequate sedation.  Results for [name], [name] (MRN [id number]) as of 10/18/2016 08:58  Ref. Range 06/25/2015 08:35 09/08/2015 08:50 08/10/2016 15:39  Ferritin Latest Ref Range: 15 - 150 ng/mL 479 (H) 337 (H) 259 (H)    Current Outpatient Prescriptions  Medication Sig Dispense Refill  . amLODipine-benazepril (LOTREL) 10-20 MG per capsule Take 1 capsule by mouth daily.    . indapamide (LOZOL) 2.5 MG tablet Take 1 tablet (2.5 mg total) by mouth daily. 30 tablet 5  . metFORMIN (GLUCOPHAGE) 500 MG tablet Take 1.5 tablets (750 mg total) by mouth 2 (two) times daily with a meal. 90 tablet 5  . ONE TOUCH ULTRA TEST test strip USE ONE STRIP TO CHECK GLUCOSE UP TO ONCE DAILY (Patient not taking: Reported on 10/18/2016) 50 each 5   No current facility-administered medications for this visit.     Allergies as of 10/18/2016  . (No Known Allergies)    Past Medical History:  Diagnosis Date  . Hypertension   . Impaired fasting glucose 09/2011   Past Surgical History:  Procedure Laterality Date  . ENDOMETRIAL ABLATION    . TONSILLECTOMY    . TUBAL LIGATION  1987   Family History  Problem Relation Age of Onset  . Cancer Father 62    stomach cancer  . Hypertension Father   . Liver disease Mother     fatty liver, age 38  . Heart disease Maternal Grandmother   . Colon cancer Neg Hx    Social History   Social History  . Marital status: Married    Spouse name: N/A  . Number of children: 3  . Years of education: N/A   Social History Main Topics  . Smoking status: Never Smoker  . Smokeless tobacco: Never Used  . Alcohol use No  . Drug use: No  . Sexual activity: Yes    Birth control/ protection: Post-menopausal, Surgical   Other Topics Concern  . None   Social History Narrative  . None    ROS:  General: Negative for anorexia, weight loss, fever, chills, fatigue, weakness. ENT: Negative for hoarseness, difficulty swallowing , nasal congestion. CV: Negative for chest pain, angina, palpitations, dyspnea on exertion, peripheral edema.  Respiratory: Negative for dyspnea at rest, dyspnea on exertion, cough, sputum, wheezing.  GI: See history of present illness. GU:  Negative for dysuria, hematuria, urinary incontinence, urinary frequency, nocturnal urination.  Endo: Negative for unusual weight change.    Physical Examination:   BP Marland Kitchen)  162/93   Pulse 71   Temp 97.8 F (36.6 C) (Oral)   Ht 5\' 6"  (1.676 m)   Wt 222 lb 12.8 oz (101.1 kg)   BMI 35.96 kg/m   General: Well-nourished, well-developed in no acute distress.  Eyes: No icterus. Mouth: Oropharyngeal mucosa moist and pink , no lesions erythema or exudate. Lungs: Clear to auscultation bilaterally.  Heart: Regular rate and rhythm, no murmurs rubs or gallops.  Abdomen: Bowel sounds are normal, nontender, nondistended, no hepatosplenomegaly or masses, no  abdominal bruits or hernia , no rebound or guarding.   Extremities: No lower extremity edema. No clubbing or deformities. Neuro: Alert and oriented x 4   Skin: Warm and dry, no jaundice.   Psych: Alert and cooperative, normal mood and affect.  Labs:  Lab Results  Component Value Date   FERRITIN 259 (H) 08/10/2016   Lab Results  Component Value Date   IRON 79 08/10/2016   IRON 77 08/10/2016   TIBC 297 08/10/2016   FERRITIN 259 (H) 08/10/2016   Lab Results  Component Value Date   WBC 4.3 08/10/2016   HCT 36.3 08/10/2016   MCV 85 08/10/2016   PLT 265 08/10/2016   Lab Results  Component Value Date   ALT 83 (H) 06/05/2015   AST 48 (H) 06/05/2015   ALKPHOS 94 06/05/2015   BILITOT 0.3 06/05/2015   Labs from November 2017, total bilirubin 0.4, alkaline phosphatase 87, AST 15, ALT 17, albumin 4.6, total cholesterol 186, LDL 109. Imaging Studies: No results found.

## 2016-11-18 NOTE — Patient Instructions (Signed)
[name]  11/18/2016     @PREFPERIOPPHARMACY @   Your procedure is scheduled on  11/25/2016  Report to [name] at  900 A.M.  Call this number if you have problems the morning of surgery:  [phone]   Remember:  Do not eat food or drink liquids after midnight.  Take these medicines the morning of surgery with A SIP OF WATER  Amlodipine.   Do not wear jewelry, make-up or nail polish.  Do not wear lotions, powders, or perfumes, or deoderant.  Do not shave 48 hours prior to surgery.  Men may shave face and neck.  Do not bring valuables to the hospital.  [name] is not responsible for any belongings or valuables.  Contacts, dentures or bridgework may not be worn into surgery.  Leave your suitcase in the car.  After surgery it may be brought to your room.  For patients admitted to the hospital, discharge time will be determined by your treatment team.  Patients discharged the day of surgery will not be allowed to drive home.   Name and phone number of your driver:   family Special instructions:  Follow the diet and prep instructions given to you by Dr Roseanne Kaufman office.  Please read over the following fact sheets that you were given. Anesthesia Post-op Instructions and Care and Recovery After Surgery       Colonoscopy, Adult A colonoscopy is an exam to look at the entire large intestine. During the exam, a lubricated, bendable tube is inserted into the anus and then passed into the rectum, colon, and other parts of the large intestine. A colonoscopy is often done as a part of normal colorectal screening or in response to certain symptoms, such as anemia, persistent diarrhea, abdominal pain, and blood in the stool. The exam can help screen for and diagnose medical problems, including:  Tumors.  Polyps.  Inflammation.  Areas of bleeding.  Tell a health care provider about:  Any allergies you have.  All medicines you are taking, including  vitamins, herbs, eye drops, creams, and over-the-counter medicines.  Any problems you or family members have had with anesthetic medicines.  Any blood disorders you have.  Any surgeries you have had.  Any medical conditions you have.  Any problems you have had passing stool. What are the risks? Generally, this is a safe procedure. However, problems may occur, including:  Bleeding.  A tear in the intestine.  A reaction to medicines given during the exam.  Infection (rare).  What happens before the procedure? Eating and drinking restrictions Follow instructions from your health care provider about eating and drinking, which may include:  A few days before the procedure - follow a low-fiber diet. Avoid nuts, seeds, dried fruit, raw fruits, and vegetables.  1-3 days before the procedure - follow a clear liquid diet. Drink only clear liquids, such as clear broth or bouillon, black coffee or tea, clear juice, clear soft drinks or sports drinks, gelatin dessert, and popsicles. Avoid any liquids that contain red or purple dye.  On the day of the procedure - do not eat or drink anything during the 2 hours before the procedure, or within the time period that your health care provider recommends.  Bowel prep If you were prescribed an oral bowel prep to clean out your colon:  Take it as told by your health care provider. Starting the day before your procedure, you will need to  drink a large amount of medicated liquid. The liquid will cause you to have multiple loose stools until your stool is almost clear or light green.  If your skin or anus gets irritated from diarrhea, you may use these to relieve the irritation: ? Medicated wipes, such as adult wet wipes with aloe and vitamin E. ? A skin soothing-product like petroleum jelly.  If you vomit while drinking the bowel prep, take a break for up to 60 minutes and then begin the bowel prep again. If vomiting continues and you cannot take  the bowel prep without vomiting, call your health care provider.  General instructions  Ask your health care provider about changing or stopping your regular medicines. This is especially important if you are taking diabetes medicines or blood thinners.  Plan to have someone take you home from the hospital or clinic. What happens during the procedure?  An IV tube may be inserted into one of your veins.  You will be given medicine to help you relax (sedative).  To reduce your risk of infection: ? Your health care team will wash or sanitize their hands. ? Your anal area will be washed with soap.  You will be asked to lie on your side with your knees bent.  Your health care provider will lubricate a long, thin, flexible tube. The tube will have a camera and a light on the end.  The tube will be inserted into your anus.  The tube will be gently eased through your rectum and colon.  Air will be delivered into your colon to keep it open. You may feel some pressure or cramping.  The camera will be used to take images during the procedure.  A small tissue sample may be removed from your body to be examined under a microscope (biopsy). If any potential problems are found, the tissue will be sent to a lab for testing.  If small polyps are found, your health care provider may remove them and have them checked for cancer cells.  The tube that was inserted into your anus will be slowly removed. The procedure may vary among health care providers and hospitals. What happens after the procedure?  Your blood pressure, heart rate, breathing rate, and blood oxygen level will be monitored until the medicines you were given have worn off.  Do not drive for 24 hours after the exam.  You may have a small amount of blood in your stool.  You may pass gas and have mild abdominal cramping or bloating due to the air that was used to inflate your colon during the exam.  It is up to you to get the  results of your procedure. Ask your health care provider, or the department performing the procedure, when your results will be ready. This information is not intended to replace advice given to you by your health care provider. Make sure you discuss any questions you have with your health care provider. Document Released: 06/04/2000 Document Revised: 04/07/2016 Document Reviewed: 08/19/2015 Elsevier Interactive Patient Education  2018 [employer].  Colonoscopy, Adult, Care After This sheet gives you information about how to care for yourself after your procedure. Your health care provider may also give you more specific instructions. If you have problems or questions, contact your health care provider. What can I expect after the procedure? After the procedure, it is common to have:  A small amount of blood in your stool for 24 hours after the procedure.  Some gas.  Mild  abdominal cramping or bloating.  Follow these instructions at home: General instructions   For the first 24 hours after the procedure: ? Do not drive or use machinery. ? Do not sign important documents. ? Do not drink alcohol. ? Do your regular daily activities at a slower pace than normal. ? Eat soft, easy-to-digest foods. ? Rest often.  Take over-the-counter or prescription medicines only as told by your health care provider.  It is up to you to get the results of your procedure. Ask your health care provider, or the department performing the procedure, when your results will be ready. Relieving cramping and bloating  Try walking around when you have cramps or feel bloated.  Apply heat to your abdomen as told by your health care provider. Use a heat source that your health care provider recommends, such as a moist heat pack or a heating pad. ? Place a towel between your skin and the heat source. ? Leave the heat on for 20-30 minutes. ? Remove the heat if your skin turns bright red. This is especially  important if you are unable to feel pain, heat, or cold. You may have a greater risk of getting burned. Eating and drinking  Drink enough fluid to keep your urine clear or pale yellow.  Resume your normal diet as instructed by your health care provider. Avoid heavy or fried foods that are hard to digest.  Avoid drinking alcohol for as long as instructed by your health care provider. Contact a health care provider if:  You have blood in your stool 2-3 days after the procedure. Get help right away if:  You have more than a small spotting of blood in your stool.  You pass large blood clots in your stool.  Your abdomen is swollen.  You have nausea or vomiting.  You have a fever.  You have increasing abdominal pain that is not relieved with medicine. This information is not intended to replace advice given to you by your health care provider. Make sure you discuss any questions you have with your health care provider. Document Released: 01/20/2004 Document Revised: 03/01/2016 Document Reviewed: 08/19/2015 Elsevier Interactive Patient Education  2018 [employer] Anesthesia is a term that refers to techniques, procedures, and medicines that help a person stay safe and comfortable during a medical procedure. Monitored anesthesia care, or sedation, is one type of anesthesia. Your anesthesia specialist may recommend sedation if you will be having a procedure that does not require you to be unconscious, such as:  Cataract surgery.  A dental procedure.  A biopsy.  A colonoscopy.  During the procedure, you may receive a medicine to help you relax (sedative). There are three levels of sedation:  Mild sedation. At this level, you may feel awake and relaxed. You will be able to follow directions.  Moderate sedation. At this level, you will be sleepy. You may not remember the procedure.  Deep sedation. At this level, you will be asleep. You will not remember  the procedure.  The more medicine you are given, the deeper your level of sedation will be. Depending on how you respond to the procedure, the anesthesia specialist may change your level of sedation or the type of anesthesia to fit your needs. An anesthesia specialist will monitor you closely during the procedure. Let your health care provider know about:  Any allergies you have.  All medicines you are taking, including vitamins, herbs, eye drops, creams, and over-the-counter medicines.  Any  use of steroids (by mouth or as a cream).  Any problems you or family members have had with sedatives and anesthetic medicines.  Any blood disorders you have.  Any surgeries you have had.  Any medical conditions you have, such as sleep apnea.  Whether you are pregnant or may be pregnant.  Any use of cigarettes, alcohol, or street drugs. What are the risks? Generally, this is a safe procedure. However, problems may occur, including:  Getting too much medicine (oversedation).  Nausea.  Allergic reaction to medicines.  Trouble breathing. If this happens, a breathing tube may be used to help with breathing. It will be removed when you are awake and breathing on your own.  Heart trouble.  Lung trouble.  Before the procedure Staying hydrated Follow instructions from your health care provider about hydration, which may include:  Up to 2 hours before the procedure - you may continue to drink clear liquids, such as water, clear fruit juice, black coffee, and plain tea.  Eating and drinking restrictions Follow instructions from your health care provider about eating and drinking, which may include:  8 hours before the procedure - stop eating heavy meals or foods such as meat, fried foods, or fatty foods.  6 hours before the procedure - stop eating light meals or foods, such as toast or cereal.  6 hours before the procedure - stop drinking milk or drinks that contain milk.  2 hours before  the procedure - stop drinking clear liquids.  Medicines Ask your health care provider about:  Changing or stopping your regular medicines. This is especially important if you are taking diabetes medicines or blood thinners.  Taking medicines such as aspirin and ibuprofen. These medicines can thin your blood. Do not take these medicines before your procedure if your health care provider instructs you not to.  Tests and exams  You will have a physical exam.  You may have blood tests done to show: ? How well your kidneys and liver are working. ? How well your blood can clot.  General instructions  Plan to have someone take you home from the hospital or clinic.  If you will be going home right after the procedure, plan to have someone with you for 24 hours.  What happens during the procedure?  Your blood pressure, heart rate, breathing, level of pain and overall condition will be monitored.  An IV tube will be inserted into one of your veins.  Your anesthesia specialist will give you medicines as needed to keep you comfortable during the procedure. This may mean changing the level of sedation.  The procedure will be performed. After the procedure  Your blood pressure, heart rate, breathing rate, and blood oxygen level will be monitored until the medicines you were given have worn off.  Do not drive for 24 hours if you received a sedative.  You may: ? Feel sleepy, clumsy, or nauseous. ? Feel forgetful about what happened after the procedure. ? Have a sore throat if you had a breathing tube during the procedure. ? Vomit. This information is not intended to replace advice given to you by your health care provider. Make sure you discuss any questions you have with your health care provider. Document Released: 03/03/2005 Document Revised: 11/14/2015 Document Reviewed: 09/28/2015 Elsevier Interactive Patient Education  2018 [employer], Care  After These instructions provide you with information about caring for yourself after your procedure. Your health care provider may also give you more specific  instructions. Your treatment has been planned according to current medical practices, but problems sometimes occur. Call your health care provider if you have any problems or questions after your procedure. What can I expect after the procedure? After your procedure, it is common to:  Feel sleepy for several hours.  Feel clumsy and have poor balance for several hours.  Feel forgetful about what happened after the procedure.  Have poor judgment for several hours.  Feel nauseous or vomit.  Have a sore throat if you had a breathing tube during the procedure.  Follow these instructions at home: For at least 24 hours after the procedure:   Do not: ? Participate in activities in which you could fall or become injured. ? Drive. ? Use heavy machinery. ? Drink alcohol. ? Take sleeping pills or medicines that cause drowsiness. ? Make important decisions or sign legal documents. ? Take care of children on your own.  Rest. Eating and drinking  Follow the diet that is recommended by your health care provider.  If you vomit, drink water, juice, or soup when you can drink without vomiting.  Make sure you have little or no nausea before eating solid foods. General instructions  Have a responsible adult stay with you until you are awake and alert.  Take over-the-counter and prescription medicines only as told by your health care provider.  If you smoke, do not smoke without supervision.  Keep all follow-up visits as told by your health care provider. This is important. Contact a health care provider if:  You keep feeling nauseous or you keep vomiting.  You feel light-headed.  You develop a rash.  You have a fever. Get help right away if:  You have trouble breathing. This information is not intended to replace advice  given to you by your health care provider. Make sure you discuss any questions you have with your health care provider. Document Released: 09/28/2015 Document Revised: 01/28/2016 Document Reviewed: 09/28/2015 Elsevier Interactive Patient Education  [employer].

## 2016-11-19 ENCOUNTER — Telehealth: Payer: Self-pay

## 2016-11-19 NOTE — Telephone Encounter (Signed)
Endo Scheduler called, procedure time for Colonoscopy w/Propofol 11/25/16 moved to 9:45am. Tried to call pt, no answer, LMOVM and informed her of new procedure time. Advised her to start drinking prep at 4:45am the morning of procedure and complete by 6:45am.

## 2016-11-22 ENCOUNTER — Encounter (HOSPITAL_COMMUNITY): Payer: Self-pay

## 2016-11-22 ENCOUNTER — Encounter (HOSPITAL_COMMUNITY)
Admission: RE | Admit: 2016-11-22 | Discharge: 2016-11-22 | Disposition: A | Discharge status: Home / Self Care | Payer: BLUE CROSS/BLUE SHIELD | Source: Ambulatory Visit | Attending: Internal Medicine | Admitting: Internal Medicine

## 2016-11-22 DIAGNOSIS — I10 Essential (primary) hypertension: Secondary | ICD-10-CM | POA: Diagnosis not present

## 2016-11-22 DIAGNOSIS — Z8249 Family history of ischemic heart disease and other diseases of the circulatory system: Secondary | ICD-10-CM | POA: Diagnosis not present

## 2016-11-22 DIAGNOSIS — E119 Type 2 diabetes mellitus without complications: Secondary | ICD-10-CM | POA: Diagnosis not present

## 2016-11-22 DIAGNOSIS — Z800 Family history of malignant neoplasm of digestive organs: Secondary | ICD-10-CM | POA: Diagnosis not present

## 2016-11-22 DIAGNOSIS — Z1211 Encounter for screening for malignant neoplasm of colon: Secondary | ICD-10-CM | POA: Diagnosis not present

## 2016-11-22 DIAGNOSIS — K6389 Other specified diseases of intestine: Secondary | ICD-10-CM | POA: Diagnosis not present

## 2016-11-22 DIAGNOSIS — D122 Benign neoplasm of ascending colon: Secondary | ICD-10-CM | POA: Diagnosis not present

## 2016-11-22 DIAGNOSIS — Z79899 Other long term (current) drug therapy: Secondary | ICD-10-CM | POA: Diagnosis not present

## 2016-11-22 DIAGNOSIS — Z7984 Long term (current) use of oral hypoglycemic drugs: Secondary | ICD-10-CM | POA: Diagnosis not present

## 2016-11-22 DIAGNOSIS — Z8379 Family history of other diseases of the digestive system: Secondary | ICD-10-CM | POA: Diagnosis not present

## 2016-11-22 HISTORY — DX: Family history of other specified conditions: Z84.89

## 2016-11-22 LAB — BASIC METABOLIC PANEL
Anion gap: 9 (ref 5–15)
BUN: 25 mg/dL — AB (ref 6–20)
CHLORIDE: 100 mmol/L — AB (ref 101–111)
CO2: 28 mmol/L (ref 22–32)
Calcium: 10.1 mg/dL (ref 8.9–10.3)
Creatinine, Ser: 1.02 mg/dL — ABNORMAL HIGH (ref 0.44–1.00)
GFR calc Af Amer: >60 mL/min (ref >60)
GFR calc non Af Amer: >60 mL/min (ref >60)
Glucose, Bld: 104 mg/dL — ABNORMAL HIGH (ref 65–99)
POTASSIUM: 3.3 mmol/L — AB (ref 3.5–5.1)
Sodium: 137 mmol/L (ref 135–145)

## 2016-11-22 LAB — CBC WITH DIFFERENTIAL/PLATELET
Basophils Absolute: 0 10*3/uL (ref 0.0–0.1)
Basophils Relative: 1 %
Eosinophils Absolute: 0.1 10*3/uL (ref 0.0–0.7)
Eosinophils Relative: 3 %
HEMATOCRIT: 36.2 % (ref 36.0–46.0)
HEMOGLOBIN: 12 g/dL (ref 12.0–15.0)
LYMPHS ABS: 1.5 10*3/uL (ref 0.7–4.0)
LYMPHS PCT: 35 %
MCH: 29.3 pg (ref 26.0–34.0)
MCHC: 33.1 g/dL (ref 30.0–36.0)
MCV: 88.3 fL (ref 78.0–100.0)
MONOS PCT: 6 %
Monocytes Absolute: 0.3 10*3/uL (ref 0.1–1.0)
NEUTROS ABS: 2.4 10*3/uL (ref 1.7–7.7)
Neutrophils Relative %: 55 %
Platelets: 267 10*3/uL (ref 150–400)
RBC: 4.1 MIL/uL (ref 3.87–5.11)
RDW: 14.2 % (ref 11.5–15.5)
WBC: 4.4 10*3/uL (ref 4.0–10.5)

## 2016-11-23 DIAGNOSIS — Z719 Counseling, unspecified: Secondary | ICD-10-CM | POA: Diagnosis not present

## 2016-11-23 DIAGNOSIS — Z008 Encounter for other general examination: Secondary | ICD-10-CM | POA: Diagnosis not present

## 2016-11-23 DIAGNOSIS — E669 Obesity, unspecified: Secondary | ICD-10-CM | POA: Diagnosis not present

## 2016-11-23 DIAGNOSIS — E782 Mixed hyperlipidemia: Secondary | ICD-10-CM | POA: Diagnosis not present

## 2016-11-23 DIAGNOSIS — E119 Type 2 diabetes mellitus without complications: Secondary | ICD-10-CM | POA: Diagnosis not present

## 2016-11-23 DIAGNOSIS — Z6834 Body mass index (BMI) 34.0-34.9, adult: Secondary | ICD-10-CM | POA: Diagnosis not present

## 2016-11-23 DIAGNOSIS — E559 Vitamin D deficiency, unspecified: Secondary | ICD-10-CM | POA: Diagnosis not present

## 2016-11-23 NOTE — Pre-Procedure Instructions (Signed)
Dr Patsey Berthold aware of K+ of 3.3. No orders given.

## 2016-11-24 ENCOUNTER — Other Ambulatory Visit: Payer: Self-pay | Admitting: Gastroenterology

## 2016-11-24 MED ORDER — POTASSIUM CHLORIDE ER 20 MEQ PO TBCR: 20.0000 meq | EXTENDED_RELEASE_TABLET | Freq: Two times a day (BID) | ORAL | 0 refills | Status: DC

## 2016-11-24 NOTE — Progress Notes (Signed)
Please let patient know her potassium is slightly low. Needs KCL 28meq bid for four doses. #4 and no refills.

## 2016-11-25 ENCOUNTER — Ambulatory Visit (HOSPITAL_COMMUNITY)
Admission: RE | Admit: 2016-11-25 | Discharge: 2016-11-25 | Disposition: A | Discharge status: Home / Self Care | Payer: BLUE CROSS/BLUE SHIELD | Source: Ambulatory Visit | Attending: Internal Medicine | Admitting: Internal Medicine

## 2016-11-25 ENCOUNTER — Ambulatory Visit (HOSPITAL_COMMUNITY): Payer: BLUE CROSS/BLUE SHIELD | Admitting: Anesthesiology

## 2016-11-25 ENCOUNTER — Encounter (HOSPITAL_COMMUNITY)
Admission: RE | Disposition: A | Discharge status: Home / Self Care | Payer: Self-pay | Source: Ambulatory Visit | Attending: Internal Medicine

## 2016-11-25 ENCOUNTER — Encounter (HOSPITAL_COMMUNITY): Payer: Self-pay

## 2016-11-25 DIAGNOSIS — K635 Polyp of colon: Secondary | ICD-10-CM | POA: Diagnosis not present

## 2016-11-25 DIAGNOSIS — I10 Essential (primary) hypertension: Secondary | ICD-10-CM | POA: Insufficient documentation

## 2016-11-25 DIAGNOSIS — Z1212 Encounter for screening for malignant neoplasm of rectum: Secondary | ICD-10-CM | POA: Diagnosis not present

## 2016-11-25 DIAGNOSIS — Z800 Family history of malignant neoplasm of digestive organs: Secondary | ICD-10-CM | POA: Insufficient documentation

## 2016-11-25 DIAGNOSIS — Z8379 Family history of other diseases of the digestive system: Secondary | ICD-10-CM | POA: Insufficient documentation

## 2016-11-25 DIAGNOSIS — E119 Type 2 diabetes mellitus without complications: Secondary | ICD-10-CM | POA: Insufficient documentation

## 2016-11-25 DIAGNOSIS — Z7984 Long term (current) use of oral hypoglycemic drugs: Secondary | ICD-10-CM | POA: Diagnosis not present

## 2016-11-25 DIAGNOSIS — Z1211 Encounter for screening for malignant neoplasm of colon: Secondary | ICD-10-CM

## 2016-11-25 DIAGNOSIS — K6389 Other specified diseases of intestine: Secondary | ICD-10-CM | POA: Diagnosis not present

## 2016-11-25 DIAGNOSIS — Z79899 Other long term (current) drug therapy: Secondary | ICD-10-CM | POA: Insufficient documentation

## 2016-11-25 DIAGNOSIS — Z8249 Family history of ischemic heart disease and other diseases of the circulatory system: Secondary | ICD-10-CM | POA: Diagnosis not present

## 2016-11-25 DIAGNOSIS — D122 Benign neoplasm of ascending colon: Secondary | ICD-10-CM | POA: Diagnosis not present

## 2016-11-25 HISTORY — PX: POLYPECTOMY: SHX5525

## 2016-11-25 HISTORY — PX: COLONOSCOPY WITH PROPOFOL: SHX5780

## 2016-11-25 HISTORY — DX: Type 2 diabetes mellitus without complications: E11.9

## 2016-11-25 LAB — GLUCOSE, CAPILLARY
GLUCOSE-CAPILLARY: 86 mg/dL (ref 65–99)
Glucose-Capillary: 88 mg/dL (ref 65–99)

## 2016-11-25 SURGERY — COLONOSCOPY WITH PROPOFOL
Anesthesia: Monitor Anesthesia Care

## 2016-11-25 MED ORDER — FENTANYL CITRATE (PF) 100 MCG/2ML IJ SOLN
25.0000 ug | Freq: Once | INTRAMUSCULAR | Status: AC
  Administered 2016-11-25: 25 ug via INTRAVENOUS

## 2016-11-25 MED ORDER — MIDAZOLAM HCL 2 MG/2ML IJ SOLN
INTRAMUSCULAR | Status: AC
  Filled 2016-11-25: qty 2

## 2016-11-25 MED ORDER — CHLORHEXIDINE GLUCONATE CLOTH 2 % EX PADS: 6.0000 | MEDICATED_PAD | Freq: Once | CUTANEOUS | Status: DC

## 2016-11-25 MED ORDER — MIDAZOLAM HCL 5 MG/5ML IJ SOLN
INTRAMUSCULAR | Status: DC | PRN
  Administered 2016-11-25: 2 mg via INTRAVENOUS

## 2016-11-25 MED ORDER — ONDANSETRON HCL 4 MG/2ML IJ SOLN: 4.0000 mg | Freq: Once | INTRAMUSCULAR | Status: DC

## 2016-11-25 MED ORDER — MIDAZOLAM HCL 2 MG/2ML IJ SOLN
1.0000 mg | INTRAMUSCULAR | Status: AC
  Administered 2016-11-25: 2 mg via INTRAVENOUS

## 2016-11-25 MED ORDER — PROPOFOL 500 MG/50ML IV EMUL
INTRAVENOUS | Status: DC | PRN
  Administered 2016-11-25: 125 ug/kg/min via INTRAVENOUS

## 2016-11-25 MED ORDER — FENTANYL CITRATE (PF) 100 MCG/2ML IJ SOLN
INTRAMUSCULAR | Status: AC
  Filled 2016-11-25: qty 2

## 2016-11-25 MED ORDER — LACTATED RINGERS IV SOLN
INTRAVENOUS | Status: DC
  Administered 2016-11-25: 09:00:00 via INTRAVENOUS

## 2016-11-25 NOTE — Op Note (Signed)
[facility] Patient Name: [name] Procedure Date: 11/25/2016 9:06 AM MRN: [id number] Date of Birth: December 10, 1960 Attending MD: Norvel Richards , MD CSN: [id number] Age: 56 Admit Type: Outpatient Procedure:                Colonoscopy Indications:              Screening for colorectal malignant neoplasm Providers:                Norvel Richards, MD, Jeanann Lewandowsky. Sharon Seller, RN,                            Aram Candela Referring MD:              Medicines:                Propofol per Anesthesia Complications:            No immediate complications. Estimated Blood Loss:     Estimated blood loss was minimal. Procedure:                Pre-Anesthesia Assessment:                           - Prior to the procedure, a History and Physical                            was performed, and patient medications and                            allergies were reviewed. The patient's tolerance of                            previous anesthesia was also reviewed. The risks                            and benefits of the procedure and the sedation                            options and risks were discussed with the patient.                            All questions were answered, and informed consent                            was obtained. Prior Anticoagulants: The patient has                            taken no previous anticoagulant or antiplatelet                            agents. ASA Grade Assessment: II - A patient with                            mild systemic disease. After reviewing the risks  and benefits, the patient was deemed in                            satisfactory condition to undergo the procedure.                           After obtaining informed consent, the colonoscope                            was passed under direct vision. Throughout the                            procedure, the patient's blood pressure, pulse, and                            oxygen  saturations were monitored continuously. The                            Colonoscope was introduced through the and advanced                            to the the cecum, identified by appendiceal orifice                            and ileocecal valve. The ileocecal valve,                            appendiceal orifice, and rectum were photographed.                            The entire colon was well visualized. The entire                            colon was well visualized. The quality of the bowel                            preparation was adequate. Scope In: 9:37:51 AM Scope Out: 9:49:59 AM Scope Withdrawal Time: 0 hours 9 minutes 11 seconds  Total Procedure Duration: 0 hours 12 minutes 8 seconds  Findings:      The perianal and digital rectal examinations were normal.      A 4 mm polyp was found in the ascending colon. The polyp was sessile.       The polyp was removed with a cold snare. Resection and retrieval were       complete. Estimated blood loss was minimal.      The exam was otherwise without abnormality on direct and retroflexion       views. Mild melanosis coliresent. Impression:               - One 4 mm polyp in the ascending colon, removed                            with a cold snare. Resected and retrieved.                           -  The examination was otherwise normal on direct                            and retroflexion views. Moderate Sedation:      Moderate (conscious) sedation was personally administered by an       anesthesia professional. The following parameters were monitored: oxygen       saturation, heart rate, blood pressure, and response to care. Total       physician intraservice time was 30 minutes. Recommendation:           - Patient has a contact number available for                            emergencies. The signs and symptoms of potential                            delayed complications were discussed with the                            patient.  Return to normal activities tomorrow.                            Written discharge instructions were provided to the                            patient.                           - Resume previous diet.                           - Continue present medications.                           - Return to GI office after studies are complete.                           - Patient has a contact number available for                            emergencies. The signs and symptoms of potential                            delayed complications were discussed with the                            patient. Return to normal activities tomorrow.                            Written discharge instructions were provided to the                            patient.                           - Continue present medications. follow-up pathology  report.                           - Repeat colonoscopy date to be determined after                            pending pathology results are reviewed for                            surveillance based on pathology results.                           - Return to GI clinic (date not yet determined). Procedure Code(s):        --- Professional ---                           [phone], Colonoscopy, flexible; with removal of                            tumor(s), polyp(s), or other lesion(s) by snare                            technique Diagnosis Code(s):        --- Professional ---                           Z12.11, Encounter for screening for malignant                            neoplasm of colon                           D12.2, Benign neoplasm of ascending colon CPT copyright 2016 American [employer] Association. All rights reserved. The codes documented in this report are preliminary and upon coder review may  be revised to meet current compliance requirements. Cristopher Estimable. Chelsey Kimberley, MD Norvel Richards, MD 11/25/2016 10:00:07 AM This report has been signed  electronically. Number of Addenda: 0

## 2016-11-25 NOTE — Anesthesia Postprocedure Evaluation (Signed)
Anesthesia Post Note  Patient: [name]  Procedure(s) Performed: Procedure(s) (LRB): COLONOSCOPY WITH PROPOFOL (N/A) POLYPECTOMY  Patient location during evaluation: PACU Anesthesia Type: MAC Level of consciousness: awake and patient cooperative Pain management: pain level controlled Vital Signs Assessment: post-procedure vital signs reviewed and stable Respiratory status: spontaneous breathing, nonlabored ventilation and respiratory function stable Cardiovascular status: blood pressure returned to baseline Postop Assessment: no signs of nausea or vomiting Anesthetic complications: no     Last Vitals:  Vitals:   11/25/16 1015 11/25/16 1022  BP: 125/75 (!) 146/79  Pulse: (!) 49 (!) 49  Resp: 13 16  Temp:  36.5 C    Last Pain:  Vitals:   11/25/16 1022  TempSrc: Oral                 Gagan Dillion J

## 2016-11-25 NOTE — Anesthesia Preprocedure Evaluation (Signed)
Anesthesia Evaluation  Patient identified by MRN, date of birth, ID band Patient awake    Reviewed: Allergy & Precautions, NPO status , Patient's Chart, lab work & pertinent test results  History of Anesthesia Complications (+) Family history of anesthesia reaction  Airway Mallampati: II  TM Distance: >3 FB Neck ROM: Full    Dental  (+) Teeth Intact   Pulmonary neg pulmonary ROS,    breath sounds clear to auscultation       Cardiovascular hypertension, Pt. on medications  Rhythm:Regular Rate:Normal     Neuro/Psych negative neurological ROS     GI/Hepatic negative GI ROS, Neg liver ROS,   Endo/Other  diabetes, Type 2, Oral Hypoglycemic Agents  Renal/GU negative Renal ROS     Musculoskeletal   Abdominal   Peds  Hematology negative hematology ROS (+)   Anesthesia Other Findings   Reproductive/Obstetrics                             Anesthesia Physical Anesthesia Plan  ASA: II  Anesthesia Plan: MAC   Post-op Pain Management:    Induction: Intravenous  PONV Risk Score and Plan:   Airway Management Planned: Simple Face Mask  Additional Equipment:   Intra-op Plan:   Post-operative Plan:   Informed Consent: I have reviewed the patients History and Physical, chart, labs and discussed the procedure including the risks, benefits and alternatives for the proposed anesthesia with the patient or authorized representative who has indicated his/her understanding and acceptance.     Plan Discussed with:   Anesthesia Plan Comments:         Anesthesia Quick Evaluation

## 2016-11-25 NOTE — Transfer of Care (Signed)
Immediate Anesthesia Transfer of Care Note  Patient: [name]  Procedure(s) Performed: Procedure(s) with comments: COLONOSCOPY WITH PROPOFOL (N/A) - 10:15am POLYPECTOMY - colon  Patient Location: PACU  Anesthesia Type:MAC  Level of Consciousness: sedated  Airway & Oxygen Therapy: Patient Spontanous Breathing and Patient connected to face mask oxygen  Post-op Assessment: Report given to RN and Post -op Vital signs reviewed and stable  Post vital signs: Reviewed and stable  Last Vitals:  Vitals:   11/25/16 0850 11/25/16 0925  BP: (!) 161/76 (!) 160/86  Pulse: (!) 55   Resp: 13 16  Temp: 36.4 C     Last Pain:  Vitals:   11/25/16 0850  TempSrc: Oral         Complications: No apparent anesthesia complications

## 2016-11-25 NOTE — H&P (Signed)
@LOGO @   Primary Care Physician:  Kathyrn Drown, MD Primary Gastroenterologist:  Dr. Gala Romney  Pre-Procedure History & Physical: HPI:  [name] is a 56 y.o. female here for first ever abnormal screening colonoscopy. No bowel symptoms. No family history of colon cancer.  Past Medical History:  Diagnosis Date  . Diabetes mellitus without complication ([place])   . Family history of adverse reaction to anesthesia    mom has PONV  . Hypertension   . Impaired fasting glucose 09/2011    Past Surgical History:  Procedure Laterality Date  . ENDOMETRIAL ABLATION    . TONSILLECTOMY    . TUBAL LIGATION  1987    Prior to Admission medications   Medication Sig Start Date End Date Taking? Authorizing Provider  amLODipine-benazepril (LOTREL) 10-20 MG per capsule Take 1 capsule by mouth daily.   Yes [provider]  indapamide (LOZOL) 2.5 MG tablet Take 1 tablet (2.5 mg total) by mouth daily. 05/01/15  Yes Kathyrn Drown, MD  metFORMIN (GLUCOPHAGE) 500 MG tablet Take 1.5 tablets (750 mg total) by mouth 2 (two) times daily with a meal. Patient taking differently: Take 500 mg by mouth daily with breakfast.  06/10/15  Yes Luking, Elayne Snare, MD  ONE TOUCH ULTRA TEST test strip USE ONE STRIP TO CHECK GLUCOSE UP TO ONCE DAILY 08/25/15  Yes Kathyrn Drown, MD  Potassium Chloride ER 20 MEQ TBCR Take 20 mEq by mouth 2 (two) times daily. 11/24/16  Yes Mahala Menghini, PA-C  polyethylene glycol-electrolytes (TRILYTE) 420 g solution Take 4,000 mLs by mouth as directed. 10/18/16   Daneil Dolin, MD    Allergies as of 10/18/2016  . (No Known Allergies)    Family History  Problem Relation Age of Onset  . Cancer Father 18       stomach cancer  . Hypertension Father   . Liver disease Mother        fatty liver, age 34  . Heart disease Maternal Grandmother   . Colon cancer Neg Hx     Social History   Social History  . Marital status: Married    Spouse name: N/A  . Number of children: 3  .  Years of education: N/A   Occupational History  . Not on file.   Social History Main Topics  . Smoking status: Never Smoker  . Smokeless tobacco: Never Used  . Alcohol use No  . Drug use: No  . Sexual activity: Yes    Birth control/ protection: Post-menopausal, Surgical   Other Topics Concern  . Not on file   Social History Narrative  . No narrative on file    Review of Systems: See HPI, otherwise negative ROS  Physical Exam: BP (!) 161/76   Pulse (!) 55   Temp 97.6 F (36.4 C) (Oral)   Resp 13   Ht 5\' 7"  (1.702 m)   Wt 220 lb (99.8 kg)   SpO2 98%   BMI 34.46 kg/m  General:   Alert,  Well-developed, well-nourished, pleasant and cooperative in NAD Mouth:  No deformity or lesions. Neck:  Supple; no masses or thyromegaly. No significant cervical adenopathy. Lungs:  Clear throughout to auscultation.   No wheezes, crackles, or rhonchi. No acute distress. Heart:  Regular rate and rhythm; no murmurs, clicks, rubs,  or gallops. Abdomen: Non-distended, normal bowel sounds.  Soft and nontender without appreciable mass or hepatosplenomegaly.  Pulses:  Normal pulses noted. Extremities:  Without clubbing or edema.  Impression /  Recommendations:   Pleasant 56 year old lady here for first ever average risk screening colonoscopy. The risks, benefits, limitations, alternatives and imponderables have been reviewed with the patient. Questions have been answered. All parties are agreeable.      Notice: This dictation was prepared with Dragon dictation along with smaller phrase technology. Any transcriptional errors that result from this process are unintentional and may not be corrected upon review.

## 2016-11-25 NOTE — Discharge Instructions (Addendum)
°Colonoscopy °Discharge Instructions ° °Read the instructions outlined below and refer to this sheet in the next few weeks. These discharge instructions provide you with general information on caring for yourself after you leave the hospital. Your doctor may also give you specific instructions. While your treatment has been planned according to the most current medical practices available, unavoidable complications occasionally occur. If you have any problems or questions after discharge, call Dr. Rourk at [phone]. °ACTIVITY °· You may resume your regular activity, but move at a slower pace for the next 24 hours.  °· Take frequent rest periods for the next 24 hours.  °· Walking will help get rid of the air and reduce the bloated feeling in your belly (abdomen).  °· No driving for 24 hours (because of the medicine (anesthesia) used during the test).   °· Do not sign any important legal documents or operate any machinery for 24 hours (because of the anesthesia used during the test).  °NUTRITION °· Drink plenty of fluids.  °· You may resume your normal diet as instructed by your doctor.  °· Begin with a light meal and progress to your normal diet. Heavy or fried foods are harder to digest and may make you feel sick to your stomach (nauseated).  °· Avoid alcoholic beverages for 24 hours or as instructed.  °MEDICATIONS °· You may resume your normal medications unless your doctor tells you otherwise.  °WHAT YOU CAN EXPECT TODAY °· Some feelings of bloating in the abdomen.  °· Passage of more gas than usual.  °· Spotting of blood in your stool or on the toilet paper.  °IF YOU HAD POLYPS REMOVED DURING THE COLONOSCOPY: °· No aspirin products for 7 days or as instructed.  °· No alcohol for 7 days or as instructed.  °· Eat a soft diet for the next 24 hours.  °FINDING OUT THE RESULTS OF YOUR TEST °Not all test results are available during your visit. If your test results are not back during the visit, make an appointment  with your caregiver to find out the results. Do not assume everything is normal if you have not heard from your caregiver or the medical facility. It is important for you to follow up on all of your test results.  °SEEK IMMEDIATE MEDICAL ATTENTION IF: °· You have more than a spotting of blood in your stool.  °· Your belly is swollen (abdominal distention).  °· You are nauseated or vomiting.  °· You have a temperature over 101.  °· You have abdominal pain or discomfort that is severe or gets worse throughout the day.  ° ° °Colon polyp information provided ° °Further recommendations to follow pending review of pathology report. ° ° °Colon Polyps °Polyps are tissue growths inside the body. Polyps can grow in many places, including the large intestine (colon). A polyp may be a round bump or a mushroom-shaped growth. You could have one polyp or several. °Most colon polyps are noncancerous (benign). However, some colon polyps can become cancerous over time. °What are the causes? °The exact cause of colon polyps is not known. °What increases the risk? °This condition is more likely to develop in people who: °· Have a family history of colon cancer or colon polyps. °· Are older than 50 or older than 45 if they are African American. °· Have inflammatory bowel disease, such as ulcerative colitis or Crohn disease. °· Are overweight. °· Smoke cigarettes. °· Do not get enough exercise. °· Drink too much alcohol. °·   Eat a diet that is: °? High in fat and red meat. °? Low in fiber. °· Had childhood cancer that was treated with abdominal radiation. ° °What are the signs or symptoms? °Most polyps do not cause symptoms. If you have symptoms, they may include: °· Blood coming from your rectum when having a bowel movement. °· Blood in your stool. The stool may look dark red or black. °· A change in bowel habits, such as constipation or diarrhea. ° °How is this diagnosed? °This condition is diagnosed with a colonoscopy. This is a  procedure that uses a lighted, flexible scope to look at the inside of your colon. °How is this treated? °Treatment for this condition involves removing any polyps that are found. Those polyps will then be tested for cancer. If cancer is found, your health care provider will talk to you about options for colon cancer treatment. °Follow these instructions at home: °Diet °· Eat plenty of fiber, such as fruits, vegetables, and whole grains. °· Eat foods that are high in calcium and vitamin D, such as milk, cheese, yogurt, eggs, liver, fish, and broccoli. °· Limit foods high in fat, red meats, and processed meats, such as hot dogs, sausage, bacon, and lunch meats. °· Maintain a healthy weight, or lose weight if recommended by your health care provider. °General instructions °· Do not smoke cigarettes. °· Do not drink alcohol excessively. °· Keep all follow-up visits as told by your health care provider. This is important. This includes keeping regularly scheduled colonoscopies. Talk to your health care provider about when you need a colonoscopy. °· Exercise every day or as told by your health care provider. °Contact a health care provider if: °· You have new or worsening bleeding during a bowel movement. °· You have new or increased blood in your stool. °· You have a change in bowel habits. °· You unexpectedly lose weight. °This information is not intended to replace advice given to you by your health care provider. Make sure you discuss any questions you have with your health care provider. °Document Released: 03/03/2004 Document Revised: 11/13/2015 Document Reviewed: 04/28/2015 °Elsevier Interactive Patient Education © 2018 Elsevier Inc. ° °

## 2016-11-26 ENCOUNTER — Other Ambulatory Visit: Payer: Self-pay | Admitting: Family Medicine

## 2016-11-26 DIAGNOSIS — Z1231 Encounter for screening mammogram for malignant neoplasm of breast: Secondary | ICD-10-CM

## 2016-11-27 ENCOUNTER — Encounter: Payer: Self-pay | Admitting: Internal Medicine

## 2016-12-01 ENCOUNTER — Encounter (HOSPITAL_COMMUNITY): Payer: Self-pay | Admitting: Internal Medicine

## 2016-12-05 ENCOUNTER — Telehealth: Payer: Self-pay | Admitting: Family Medicine

## 2016-12-05 NOTE — Telephone Encounter (Signed)
Please let the patient know that gastroenterology send [place] a copy of her EKG. It does showed left ventricular hypertrophy. I would recommend for the patient to do a regular follow-up somewhere within the next 4 weeks to discuss this further. It is not emergent but can be a sign that she is developing heart related issues related to her blood pressure.

## 2016-12-06 ENCOUNTER — Ambulatory Visit (HOSPITAL_COMMUNITY)
Admission: RE | Admit: 2016-12-06 | Discharge: 2016-12-06 | Disposition: A | Discharge status: Home / Self Care | Payer: BLUE CROSS/BLUE SHIELD | Source: Ambulatory Visit | Attending: Family Medicine | Admitting: Family Medicine

## 2016-12-06 DIAGNOSIS — Z1231 Encounter for screening mammogram for malignant neoplasm of breast: Secondary | ICD-10-CM | POA: Diagnosis not present

## 2016-12-06 NOTE — Telephone Encounter (Signed)
[name] ([place])

## 2016-12-06 NOTE — Telephone Encounter (Signed)
Spoke with patient and informed her per Dr.Scott Luking-  gastroenterology sent  [place] a copy of your EKG. It does showed left ventricular hypertrophy. Dr.Scott  would recommend for the you to do a regular follow-up somewhere within the next 4 weeks to discuss this further. It is not emergent but can be a sign that you are  developing heart related issues related to you r blood pressure. Patient verbalized understanding and was transferred to the front desk to schedule an office visit.

## 2016-12-06 NOTE — Telephone Encounter (Signed)
Left message return call 12/06/16  

## 2016-12-23 ENCOUNTER — Other Ambulatory Visit: Payer: Self-pay

## 2016-12-23 DIAGNOSIS — R7989 Other specified abnormal findings of blood chemistry: Secondary | ICD-10-CM

## 2016-12-23 DIAGNOSIS — R945 Abnormal results of liver function studies: Secondary | ICD-10-CM

## 2017-01-03 ENCOUNTER — Ambulatory Visit: Payer: BLUE CROSS/BLUE SHIELD | Admitting: Family Medicine

## 2017-01-11 ENCOUNTER — Ambulatory Visit (INDEPENDENT_AMBULATORY_CARE_PROVIDER_SITE_OTHER): Payer: BLUE CROSS/BLUE SHIELD | Admitting: Family Medicine

## 2017-01-11 ENCOUNTER — Encounter: Payer: Self-pay | Admitting: Family Medicine

## 2017-01-11 VITALS — BP 120/80 | Ht 66.0 in | Wt 222.0 lb

## 2017-01-11 DIAGNOSIS — E784 Other hyperlipidemia: Secondary | ICD-10-CM

## 2017-01-11 DIAGNOSIS — I10 Essential (primary) hypertension: Secondary | ICD-10-CM | POA: Diagnosis not present

## 2017-01-11 DIAGNOSIS — M109 Gout, unspecified: Secondary | ICD-10-CM | POA: Diagnosis not present

## 2017-01-11 DIAGNOSIS — E119 Type 2 diabetes mellitus without complications: Secondary | ICD-10-CM | POA: Diagnosis not present

## 2017-01-11 DIAGNOSIS — R9431 Abnormal electrocardiogram [ECG] [EKG]: Secondary | ICD-10-CM

## 2017-01-11 DIAGNOSIS — E7849 Other hyperlipidemia: Secondary | ICD-10-CM

## 2017-01-11 LAB — POCT GLYCOSYLATED HEMOGLOBIN (HGB A1C): HEMOGLOBIN A1C: 5.3

## 2017-01-11 MED ORDER — METFORMIN HCL 500 MG PO TABS: 500.0000 mg | ORAL_TABLET | Freq: Every day | ORAL | 1 refills | Status: DC

## 2017-01-11 NOTE — Progress Notes (Signed)
   Subjective:    Patient ID: [name], female    DOB: [birth year], 56 y.o.   MRN: [id number]  Diabetes  She has type 2 diabetes mellitus. Her disease course has been stable. There are no hypoglycemic associated symptoms. Pertinent negatives for hypoglycemia include no confusion. Pertinent negatives for diabetes include no chest pain, no fatigue, no polydipsia, no polyphagia and no weakness. There are no hypoglycemic complications. Symptoms are stable. She has not had a previous visit with a dietitian. She participates in exercise daily. She does not see a podiatrist.Eye exam is not current.  Pt states she was here for her heart. Results for orders placed or performed in visit on 01/11/17  POCT glycosylated hemoglobin (Hb A1C)  Result Value Ref Range   Hemoglobin A1C 5.3      Review of Systems  Constitutional: Negative for activity change, appetite change and fatigue.  HENT: Negative for congestion.   Respiratory: Negative for cough.   Cardiovascular: Negative for chest pain.  Gastrointestinal: Negative for abdominal pain.  Endocrine: Negative for polydipsia and polyphagia.  Neurological: Negative for weakness.  Psychiatric/Behavioral: Negative for confusion.       Objective:   Physical Exam  Constitutional: She appears well-nourished. No distress.  Cardiovascular: Normal rate, regular rhythm and normal heart sounds.   No murmur heard. Pulmonary/Chest: Effort normal and breath sounds normal. No respiratory distress.  Musculoskeletal: She exhibits no edema.  Lymphadenopathy:    She has no cervical adenopathy.  Neurological: She is alert. She exhibits normal muscle tone.  Psychiatric: Her behavior is normal.  Vitals reviewed.   Patient denies any PND orthopnea denies swelling in the legs denies getting short of breath with activity Diabetic foot exam normal Patient does have bunion developing on the left side I doubt that it's gout EKG was reviewed in detail with the  patient possibility LVH and then the need to have this looked into further No sign of CHF on today's exam no pedal edema Greater and 25 minutes spent on all these issues and discussion greater than half the time     Assessment & Plan:  Diabetes good control taking metformin 500 mg daily.  Blood pressure good control continue current measures  Hyperlipidemia previous labs reviewed Sore ordered await the results  Bunion patient concerned about the possibility of gout will check uric acid level  Abnormal EKG with LVH-to do echo to evaluate for possible left ventricular hypertrophy importance of weight control exercise watching diet minimizing salt keeping blood pressure under control with stressed.

## 2017-01-13 ENCOUNTER — Ambulatory Visit (HOSPITAL_COMMUNITY)
Admission: RE | Admit: 2017-01-13 | Discharge: 2017-01-13 | Disposition: A | Discharge status: Home / Self Care | Payer: BLUE CROSS/BLUE SHIELD | Source: Ambulatory Visit | Attending: Family Medicine | Admitting: Family Medicine

## 2017-01-13 DIAGNOSIS — R9431 Abnormal electrocardiogram [ECG] [EKG]: Secondary | ICD-10-CM

## 2017-01-13 DIAGNOSIS — I878 Other specified disorders of veins: Secondary | ICD-10-CM | POA: Insufficient documentation

## 2017-01-13 NOTE — Progress Notes (Signed)
*  PRELIMINARY RESULTS* Echocardiogram 2D Echocardiogram has been performed.  Leavy Cella 01/13/2017, 1:47 PM

## 2017-01-14 ENCOUNTER — Encounter: Payer: Self-pay | Admitting: Family Medicine

## 2017-01-14 DIAGNOSIS — I517 Cardiomegaly: Secondary | ICD-10-CM | POA: Insufficient documentation

## 2017-02-14 ENCOUNTER — Ambulatory Visit: Payer: BLUE CROSS/BLUE SHIELD | Admitting: Gastroenterology

## 2017-03-01 DIAGNOSIS — E669 Obesity, unspecified: Secondary | ICD-10-CM | POA: Diagnosis not present

## 2017-03-01 DIAGNOSIS — I10 Essential (primary) hypertension: Secondary | ICD-10-CM | POA: Diagnosis not present

## 2017-03-01 DIAGNOSIS — E782 Mixed hyperlipidemia: Secondary | ICD-10-CM | POA: Diagnosis not present

## 2017-03-01 DIAGNOSIS — Z719 Counseling, unspecified: Secondary | ICD-10-CM | POA: Diagnosis not present

## 2017-03-01 DIAGNOSIS — E119 Type 2 diabetes mellitus without complications: Secondary | ICD-10-CM | POA: Diagnosis not present

## 2017-03-01 DIAGNOSIS — Z6834 Body mass index (BMI) 34.0-34.9, adult: Secondary | ICD-10-CM | POA: Diagnosis not present

## 2017-03-01 DIAGNOSIS — E559 Vitamin D deficiency, unspecified: Secondary | ICD-10-CM | POA: Diagnosis not present

## 2017-03-01 DIAGNOSIS — Z008 Encounter for other general examination: Secondary | ICD-10-CM | POA: Diagnosis not present

## 2017-03-22 DIAGNOSIS — E782 Mixed hyperlipidemia: Secondary | ICD-10-CM | POA: Diagnosis not present

## 2017-03-22 DIAGNOSIS — Z719 Counseling, unspecified: Secondary | ICD-10-CM | POA: Diagnosis not present

## 2017-03-22 DIAGNOSIS — Z6834 Body mass index (BMI) 34.0-34.9, adult: Secondary | ICD-10-CM | POA: Diagnosis not present

## 2017-03-22 DIAGNOSIS — Z008 Encounter for other general examination: Secondary | ICD-10-CM | POA: Diagnosis not present

## 2017-03-22 DIAGNOSIS — E669 Obesity, unspecified: Secondary | ICD-10-CM | POA: Diagnosis not present

## 2017-03-22 DIAGNOSIS — E119 Type 2 diabetes mellitus without complications: Secondary | ICD-10-CM | POA: Diagnosis not present

## 2017-03-22 DIAGNOSIS — E559 Vitamin D deficiency, unspecified: Secondary | ICD-10-CM | POA: Diagnosis not present

## 2017-04-25 ENCOUNTER — Telehealth: Payer: Self-pay | Admitting: Family Medicine

## 2017-04-25 NOTE — Telephone Encounter (Signed)
Review blood work results in results folder. °

## 2017-05-02 ENCOUNTER — Encounter: Payer: Self-pay | Admitting: Family Medicine

## 2017-05-02 NOTE — Telephone Encounter (Signed)
A letter was dictated to be sent to the patient she has a follow-up visit in January

## 2017-07-14 ENCOUNTER — Ambulatory Visit: Payer: BLUE CROSS/BLUE SHIELD | Admitting: Family Medicine

## 2017-08-04 ENCOUNTER — Encounter: Payer: Self-pay | Admitting: Family Medicine

## 2017-09-02 DIAGNOSIS — E119 Type 2 diabetes mellitus without complications: Secondary | ICD-10-CM | POA: Diagnosis not present

## 2017-09-02 DIAGNOSIS — Z008 Encounter for other general examination: Secondary | ICD-10-CM | POA: Diagnosis not present

## 2017-09-02 DIAGNOSIS — E669 Obesity, unspecified: Secondary | ICD-10-CM | POA: Diagnosis not present

## 2017-09-02 DIAGNOSIS — E559 Vitamin D deficiency, unspecified: Secondary | ICD-10-CM | POA: Diagnosis not present

## 2017-09-06 DIAGNOSIS — E119 Type 2 diabetes mellitus without complications: Secondary | ICD-10-CM | POA: Diagnosis not present

## 2017-09-06 DIAGNOSIS — E782 Mixed hyperlipidemia: Secondary | ICD-10-CM | POA: Diagnosis not present

## 2017-09-06 DIAGNOSIS — Z79899 Other long term (current) drug therapy: Secondary | ICD-10-CM | POA: Diagnosis not present

## 2017-09-06 DIAGNOSIS — E559 Vitamin D deficiency, unspecified: Secondary | ICD-10-CM | POA: Diagnosis not present

## 2017-09-06 DIAGNOSIS — E669 Obesity, unspecified: Secondary | ICD-10-CM | POA: Diagnosis not present

## 2017-09-06 DIAGNOSIS — Z713 Dietary counseling and surveillance: Secondary | ICD-10-CM | POA: Diagnosis not present

## 2017-09-06 DIAGNOSIS — Z139 Encounter for screening, unspecified: Secondary | ICD-10-CM | POA: Diagnosis not present

## 2017-09-20 ENCOUNTER — Telehealth: Payer: Self-pay | Admitting: Family Medicine

## 2017-09-20 DIAGNOSIS — E119 Type 2 diabetes mellitus without complications: Secondary | ICD-10-CM | POA: Diagnosis not present

## 2017-09-20 DIAGNOSIS — I10 Essential (primary) hypertension: Secondary | ICD-10-CM | POA: Diagnosis not present

## 2017-09-20 DIAGNOSIS — E782 Mixed hyperlipidemia: Secondary | ICD-10-CM | POA: Diagnosis not present

## 2017-09-20 DIAGNOSIS — E559 Vitamin D deficiency, unspecified: Secondary | ICD-10-CM | POA: Diagnosis not present

## 2017-09-20 NOTE — Telephone Encounter (Signed)
Review lab results from [facility] in results folder.

## 2017-09-21 ENCOUNTER — Encounter: Payer: Self-pay | Admitting: Family Medicine

## 2017-09-21 NOTE — Telephone Encounter (Signed)
Right send the patient a green card needs follow-up by June

## 2017-09-22 NOTE — Telephone Encounter (Signed)
Green card mailed. Stated she needed follow up by June

## 2017-10-25 DIAGNOSIS — Z713 Dietary counseling and surveillance: Secondary | ICD-10-CM | POA: Diagnosis not present

## 2017-10-25 DIAGNOSIS — E669 Obesity, unspecified: Secondary | ICD-10-CM | POA: Diagnosis not present

## 2017-10-28 ENCOUNTER — Other Ambulatory Visit: Payer: Self-pay | Admitting: Family Medicine

## 2017-11-04 ENCOUNTER — Other Ambulatory Visit: Payer: Self-pay | Admitting: Family Medicine

## 2017-11-04 DIAGNOSIS — Z1231 Encounter for screening mammogram for malignant neoplasm of breast: Secondary | ICD-10-CM

## 2017-12-12 ENCOUNTER — Ambulatory Visit (HOSPITAL_COMMUNITY)
Admission: RE | Admit: 2017-12-12 | Discharge: 2017-12-12 | Disposition: A | Discharge status: Home / Self Care | Payer: BLUE CROSS/BLUE SHIELD | Source: Ambulatory Visit | Attending: Family Medicine | Admitting: Family Medicine

## 2017-12-12 ENCOUNTER — Encounter: Payer: Self-pay | Admitting: Family Medicine

## 2017-12-12 ENCOUNTER — Ambulatory Visit: Payer: BLUE CROSS/BLUE SHIELD | Admitting: Family Medicine

## 2017-12-12 VITALS — BP 132/75 | Ht 66.0 in | Wt 230.0 lb

## 2017-12-12 DIAGNOSIS — E119 Type 2 diabetes mellitus without complications: Secondary | ICD-10-CM | POA: Diagnosis not present

## 2017-12-12 DIAGNOSIS — Z1231 Encounter for screening mammogram for malignant neoplasm of breast: Secondary | ICD-10-CM | POA: Diagnosis not present

## 2017-12-12 DIAGNOSIS — I10 Essential (primary) hypertension: Secondary | ICD-10-CM | POA: Diagnosis not present

## 2017-12-12 LAB — POCT GLYCOSYLATED HEMOGLOBIN (HGB A1C): HEMOGLOBIN A1C: 5.4 % (ref 4.0–5.6)

## 2017-12-12 NOTE — Patient Instructions (Signed)
Diabetes Mellitus and Nutrition When you have diabetes (diabetes mellitus), it is very important to have healthy eating habits because your blood sugar (glucose) levels are greatly affected by what you eat and drink. Eating healthy foods in the appropriate amounts, at about the same times every day, can help you:  Control your blood glucose.  Lower your risk of heart disease.  Improve your blood pressure.  Reach or maintain a healthy weight.  Every person with diabetes is different, and each person has different needs for a meal plan. Your health care provider may recommend that you work with a diet and nutrition specialist (dietitian) to make a meal plan that is best for you. Your meal plan may vary depending on factors such as:  The calories you need.  The medicines you take.  Your weight.  Your blood glucose, blood pressure, and cholesterol levels.  Your activity level.  Other health conditions you have, such as heart or kidney disease.  How do carbohydrates affect me? Carbohydrates affect your blood glucose level more than any other type of food. Eating carbohydrates naturally increases the amount of glucose in your blood. Carbohydrate counting is a method for keeping track of how many carbohydrates you eat. Counting carbohydrates is important to keep your blood glucose at a healthy level, especially if you use insulin or take certain oral diabetes medicines. It is important to know how many carbohydrates you can safely have in each meal. This is different for every person. Your dietitian can help you calculate how many carbohydrates you should have at each meal and for snack. Foods that contain carbohydrates include:  Bread, cereal, rice, pasta, and crackers.  Potatoes and corn.  Peas, beans, and lentils.  Milk and yogurt.  Fruit and juice.  Desserts, such as cakes, cookies, ice cream, and candy.  How does alcohol affect me? Alcohol can cause a sudden decrease in blood  glucose (hypoglycemia), especially if you use insulin or take certain oral diabetes medicines. Hypoglycemia can be a life-threatening condition. Symptoms of hypoglycemia (sleepiness, dizziness, and confusion) are similar to symptoms of having too much alcohol. If your health care provider says that alcohol is safe for you, follow these guidelines:  Limit alcohol intake to no more than 1 drink per day for nonpregnant women and 2 drinks per day for men. One drink equals 12 oz of beer, 5 oz of wine, or 1 oz of hard liquor.  Do not drink on an empty stomach.  Keep yourself hydrated with water, diet soda, or unsweetened iced tea.  Keep in mind that regular soda, juice, and other mixers may contain a lot of sugar and must be counted as carbohydrates.  What are tips for following this plan? Reading food labels  Start by checking the serving size on the label. The amount of calories, carbohydrates, fats, and other nutrients listed on the label are based on one serving of the food. Many foods contain more than one serving per package.  Check the total grams (g) of carbohydrates in one serving. You can calculate the number of servings of carbohydrates in one serving by dividing the total carbohydrates by 15. For example, if a food has 30 g of total carbohydrates, it would be equal to 2 servings of carbohydrates.  Check the number of grams (g) of saturated and trans fats in one serving. Choose foods that have low or no amount of these fats.  Check the number of milligrams (mg) of sodium in one serving. Most people   should limit total sodium intake to less than 2,300 mg per day.  Always check the nutrition information of foods labeled as "low-fat" or "nonfat". These foods may be higher in added sugar or refined carbohydrates and should be avoided.  Talk to your dietitian to identify your daily goals for nutrients listed on the label. Shopping  Avoid buying canned, premade, or processed foods. These  foods tend to be high in fat, sodium, and added sugar.  Shop around the outside edge of the grocery store. This includes fresh fruits and vegetables, bulk grains, fresh meats, and fresh dairy. Cooking  Use low-heat cooking methods, such as baking, instead of high-heat cooking methods like deep frying.  Cook using healthy oils, such as olive, canola, or sunflower oil.  Avoid cooking with butter, cream, or high-fat meats. Meal planning  Eat meals and snacks regularly, preferably at the same times every day. Avoid going long periods of time without eating.  Eat foods high in fiber, such as fresh fruits, vegetables, beans, and whole grains. Talk to your dietitian about how many servings of carbohydrates you can eat at each meal.  Eat 4-6 ounces of lean protein each day, such as lean meat, chicken, fish, eggs, or tofu. 1 ounce is equal to 1 ounce of meat, chicken, or fish, 1 egg, or 1/4 cup of tofu.  Eat some foods each day that contain healthy fats, such as avocado, nuts, seeds, and fish. Lifestyle   Check your blood glucose regularly.  Exercise at least 30 minutes 5 or more days each week, or as told by your health care provider.  Take medicines as told by your health care provider.  Do not use any products that contain nicotine or tobacco, such as cigarettes and e-cigarettes. If you need help quitting, ask your health care provider.  Work with a counselor or diabetes educator to identify strategies to manage stress and any emotional and social challenges. What are some questions to ask my health care provider?  Do I need to meet with a diabetes educator?  Do I need to meet with a dietitian?  What number can I call if I have questions?  When are the best times to check my blood glucose? Where to find more information:  American Diabetes Association: diabetes.org/food-and-fitness/food  Academy of Nutrition and Dietetics:  www.eatright.org/resources/health/diseases-and-conditions/diabetes  National Institute of Diabetes and Digestive and Kidney Diseases (NIH): www.niddk.nih.gov/health-information/diabetes/overview/diet-eating-physical-activity Summary  A healthy meal plan will help you control your blood glucose and maintain a healthy lifestyle.  Working with a diet and nutrition specialist (dietitian) can help you make a meal plan that is best for you.  Keep in mind that carbohydrates and alcohol have immediate effects on your blood glucose levels. It is important to count carbohydrates and to use alcohol carefully. This information is not intended to replace advice given to you by your health care provider. Make sure you discuss any questions you have with your health care provider. Document Released: 03/04/2005 Document Revised: 07/12/2016 Document Reviewed: 07/12/2016 Elsevier Interactive Patient Education  2018 [employer] Inc.  

## 2017-12-12 NOTE — Progress Notes (Signed)
   Subjective:    Patient ID: [name], female    DOB: October 05, 1960, 57 y.o.   MRN: [id number]  HPI Patient is here today to follow up on Dm.She states she eats healthy.She gets exercise three- six times per weeks. Does not see any specialists.   The patient was seen today as part of a comprehensive diabetic check up.The patient relates medication compliance. No significant side effects to the medications. Denies any low glucose spells. Relates compliance with diet to a reasonable level. Patient does do labwork intermittently and understands the dangers of diabetes.  Patient for blood pressure check up. Patient relates compliance with meds. Todays BP reviewed with the patient. Patient denies issues with medication. Patient relates reasonable diet. Patient tries to minimize salt. Patient aware of BP goals.    Results for orders placed or performed in visit on 12/12/17  POCT glycosylated hemoglobin (Hb A1C)  Result Value Ref Range   Hemoglobin A1C 5.4 4.0 - 5.6 %   HbA1c POC (<> result, manual entry)  4.0 - 5.6 %   HbA1c, POC (prediabetic range)  5.7 - 6.4 %   HbA1c, POC (controlled diabetic range)  0.0 - 7.0 %      Review of Systems  Constitutional: Negative for activity change, appetite change and fatigue.  HENT: Negative for congestion and rhinorrhea.   Respiratory: Negative for cough and shortness of breath.   Cardiovascular: Negative for chest pain and leg swelling.  Gastrointestinal: Negative for abdominal pain and constipation.  Endocrine: Negative for polydipsia and polyphagia.  Skin: Negative for color change.  Neurological: Negative for weakness.  Psychiatric/Behavioral: Negative for confusion.       Objective:   Physical Exam  Constitutional: She appears well-nourished. No distress.  Cardiovascular: Normal rate, regular rhythm and normal heart sounds.  No murmur heard. Pulmonary/Chest: Effort normal and breath sounds normal. No respiratory distress.    Musculoskeletal: She exhibits no edema.  Lymphadenopathy:    She has no cervical adenopathy.  Neurological: She is alert. She exhibits normal muscle tone.  Psychiatric: Her behavior is normal.  Vitals reviewed.         Assessment & Plan:  Diabetes good control Blood pressure good control Cholesterol fair control She is under the care of a nurse practitioner several times per year at work she comes here on a yearly basis She gets her wellness checkups outside of the office on a regular basis

## 2018-01-10 DIAGNOSIS — Z008 Encounter for other general examination: Secondary | ICD-10-CM | POA: Diagnosis not present

## 2018-01-10 DIAGNOSIS — E559 Vitamin D deficiency, unspecified: Secondary | ICD-10-CM | POA: Diagnosis not present

## 2018-01-10 DIAGNOSIS — Z719 Counseling, unspecified: Secondary | ICD-10-CM | POA: Diagnosis not present

## 2018-01-10 DIAGNOSIS — E119 Type 2 diabetes mellitus without complications: Secondary | ICD-10-CM | POA: Diagnosis not present

## 2018-03-14 DIAGNOSIS — Z0130 Encounter for examination of blood pressure without abnormal findings: Secondary | ICD-10-CM | POA: Diagnosis not present

## 2018-03-21 DIAGNOSIS — Z23 Encounter for immunization: Secondary | ICD-10-CM | POA: Diagnosis not present

## 2018-03-28 DIAGNOSIS — Z0130 Encounter for examination of blood pressure without abnormal findings: Secondary | ICD-10-CM | POA: Diagnosis not present

## 2018-04-17 ENCOUNTER — Observation Stay (HOSPITAL_COMMUNITY)
Admission: EM | Admit: 2018-04-17 | Discharge: 2018-04-18 | Disposition: A | Discharge status: Home / Self Care | Payer: BLUE CROSS/BLUE SHIELD | Attending: Internal Medicine | Admitting: Internal Medicine

## 2018-04-17 ENCOUNTER — Encounter (HOSPITAL_COMMUNITY): Payer: Self-pay | Admitting: Emergency Medicine

## 2018-04-17 ENCOUNTER — Emergency Department (HOSPITAL_COMMUNITY): Payer: BLUE CROSS/BLUE SHIELD

## 2018-04-17 ENCOUNTER — Other Ambulatory Visit: Payer: Self-pay

## 2018-04-17 ENCOUNTER — Telehealth: Payer: Self-pay | Admitting: *Deleted

## 2018-04-17 DIAGNOSIS — IMO0002 Reserved for concepts with insufficient information to code with codable children: Secondary | ICD-10-CM | POA: Diagnosis present

## 2018-04-17 DIAGNOSIS — Z79899 Other long term (current) drug therapy: Secondary | ICD-10-CM | POA: Insufficient documentation

## 2018-04-17 DIAGNOSIS — R0789 Other chest pain: Principal | ICD-10-CM | POA: Insufficient documentation

## 2018-04-17 DIAGNOSIS — E119 Type 2 diabetes mellitus without complications: Secondary | ICD-10-CM | POA: Insufficient documentation

## 2018-04-17 DIAGNOSIS — R079 Chest pain, unspecified: Secondary | ICD-10-CM | POA: Diagnosis present

## 2018-04-17 DIAGNOSIS — I10 Essential (primary) hypertension: Secondary | ICD-10-CM | POA: Diagnosis not present

## 2018-04-17 DIAGNOSIS — Z7984 Long term (current) use of oral hypoglycemic drugs: Secondary | ICD-10-CM | POA: Insufficient documentation

## 2018-04-17 DIAGNOSIS — E1165 Type 2 diabetes mellitus with hyperglycemia: Secondary | ICD-10-CM | POA: Diagnosis present

## 2018-04-17 LAB — BASIC METABOLIC PANEL
ANION GAP: 10 (ref 5–15)
BUN: 25 mg/dL — ABNORMAL HIGH (ref 6–20)
CHLORIDE: 101 mmol/L (ref 98–111)
CO2: 28 mmol/L (ref 22–32)
Calcium: 10 mg/dL (ref 8.9–10.3)
Creatinine, Ser: 0.93 mg/dL (ref 0.44–1.00)
GFR calc non Af Amer: >60 mL/min (ref >60)
Glucose, Bld: 105 mg/dL — ABNORMAL HIGH (ref 70–99)
Potassium: 3.1 mmol/L — ABNORMAL LOW (ref 3.5–5.1)
Sodium: 139 mmol/L (ref 135–145)

## 2018-04-17 LAB — CBC
HEMATOCRIT: 37.3 % (ref 36.0–46.0)
Hemoglobin: 11.9 g/dL — ABNORMAL LOW (ref 12.0–15.0)
MCH: 28.1 pg (ref 26.0–34.0)
MCHC: 31.9 g/dL (ref 30.0–36.0)
MCV: 88 fL (ref 80.0–100.0)
NRBC: 0 % (ref 0.0–0.2)
PLATELETS: 259 10*3/uL (ref 150–400)
RBC: 4.24 MIL/uL (ref 3.87–5.11)
RDW: 14.1 % (ref 11.5–15.5)
WBC: 3.8 10*3/uL — ABNORMAL LOW (ref 4.0–10.5)

## 2018-04-17 LAB — TROPONIN I
Troponin I: <0.03 ng/mL (ref <0.03)
Troponin I: 0.05 ng/mL (ref <0.03)
Troponin I: <0.03 ng/mL (ref <0.03)

## 2018-04-17 LAB — MAGNESIUM: MAGNESIUM: 1.9 mg/dL (ref 1.7–2.4)

## 2018-04-17 MED ORDER — ACETAMINOPHEN 325 MG PO TABS: 650.0000 mg | ORAL_TABLET | Freq: Four times a day (QID) | ORAL | Status: DC | PRN

## 2018-04-17 MED ORDER — ONDANSETRON HCL 4 MG/2ML IJ SOLN: 4.0000 mg | Freq: Four times a day (QID) | INTRAMUSCULAR | Status: DC | PRN

## 2018-04-17 MED ORDER — ENOXAPARIN SODIUM 40 MG/0.4ML ~~LOC~~ SOLN
40.0000 mg | SUBCUTANEOUS | Status: DC
  Administered 2018-04-17: 40 mg via SUBCUTANEOUS
  Filled 2018-04-17: qty 0.4

## 2018-04-17 MED ORDER — POTASSIUM CHLORIDE CRYS ER 20 MEQ PO TBCR
40.0000 meq | EXTENDED_RELEASE_TABLET | ORAL | Status: AC
  Administered 2018-04-17: 40 meq via ORAL
  Filled 2018-04-17: qty 2

## 2018-04-17 MED ORDER — AMLODIPINE BESYLATE 5 MG PO TABS
10.0000 mg | ORAL_TABLET | Freq: Every day | ORAL | Status: DC
  Administered 2018-04-18: 10 mg via ORAL
  Filled 2018-04-17: qty 2

## 2018-04-17 MED ORDER — ONDANSETRON HCL 4 MG PO TABS: 4.0000 mg | ORAL_TABLET | Freq: Four times a day (QID) | ORAL | Status: DC | PRN

## 2018-04-17 MED ORDER — INSULIN ASPART 100 UNIT/ML ~~LOC~~ SOLN: 0.0000 [IU] | Freq: Three times a day (TID) | SUBCUTANEOUS | Status: DC

## 2018-04-17 MED ORDER — AMLODIPINE BESY-BENAZEPRIL HCL 10-40 MG PO CAPS: 1.0000 | ORAL_CAPSULE | Freq: Every day | ORAL | Status: DC

## 2018-04-17 MED ORDER — BENAZEPRIL HCL 20 MG PO TABS
40.0000 mg | ORAL_TABLET | Freq: Every day | ORAL | Status: DC
  Administered 2018-04-18: 40 mg via ORAL
  Filled 2018-04-17: qty 1
  Filled 2018-04-17: qty 4
  Filled 2018-04-17 (×2): qty 1

## 2018-04-17 MED ORDER — ACETAMINOPHEN 650 MG RE SUPP: 650.0000 mg | Freq: Four times a day (QID) | RECTAL | Status: DC | PRN

## 2018-04-17 MED ORDER — POLYETHYLENE GLYCOL 3350 17 G PO PACK: 17.0000 g | PACK | Freq: Every day | ORAL | Status: DC | PRN

## 2018-04-17 MED ORDER — INDAPAMIDE 2.5 MG PO TABS
2.5000 mg | ORAL_TABLET | Freq: Every day | ORAL | Status: DC
  Administered 2018-04-18: 2.5 mg via ORAL
  Filled 2018-04-17 (×3): qty 1

## 2018-04-17 MED ORDER — ASPIRIN 81 MG PO CHEW
324.0000 mg | CHEWABLE_TABLET | Freq: Once | ORAL | Status: AC
  Administered 2018-04-17: 324 mg via ORAL
  Filled 2018-04-17: qty 4

## 2018-04-17 MED ORDER — POTASSIUM CHLORIDE CRYS ER 20 MEQ PO TBCR
40.0000 meq | EXTENDED_RELEASE_TABLET | Freq: Once | ORAL | Status: AC
  Administered 2018-04-17: 40 meq via ORAL
  Filled 2018-04-17: qty 2

## 2018-04-17 NOTE — Telephone Encounter (Signed)
Pt called states she is having palpitations for past 2 weeks. Really bad last night, nausea one time, feels sweaty at times and having a heaviness on center of chest. Advised pt to go to ED. Pt states she will go to [facility].

## 2018-04-17 NOTE — ED Triage Notes (Signed)
Having chest  Pain "on and off for 3 weeks.  Rates pain 4/10, SOB, palpitations, and heaviness in chest.

## 2018-04-17 NOTE — Telephone Encounter (Signed)
I agree with this assessment 

## 2018-04-17 NOTE — ED Notes (Signed)
CRITICAL VALUE ALERT  Critical Value:  Troponin 0.05  Date & Time Notied:  04/17/18 @1458   Provider Notified: Dr Karen Chafe  Orders Received/Actions taken: see new orders

## 2018-04-17 NOTE — H&P (Addendum)
History and Physical    [name] BWG:[id number] DOB: 12/05/1960 DOA: 04/17/2018  PCP: Kathyrn Drown, MD   Patient coming from: Home  I have personally briefly reviewed patient's old medical records in [place]  Chief Complaint: Chest pain  HPI: [name] is a 57 y.o. female with medical history significant for HLD, DM, fatty liver, HTN, resented to the ED with complaints of chest pain intermittent over the past 3 weeks lasting 1 to 2 minutes described as a heaviness in her mid chest.  Pain is associated with palpitations and diaphoresis.  Chest pain is unrelated to activity or food, not worse with deep breathing. She denies associated shortness of breath.  She denies recent travel, personal or family history of blood clots.  Denies leg pain or swelling. Never smoker.  History of heart attacks in her father and grandmother, both in their 24s  ED Course: Stable vitals.  Initial troponin normal mildly increased 0.03 >> 0.05.  EKG without ST or T wave abnormalities essentially unchanged from prior. K low 3.1.  Two-view chest x-ray negative for acute abnormality.  324mg  aspirin given.  Hospitalist called to admit for chest pain rule out ACS.  Review of Systems: As per HPI all other systems reviewed and negative  Past Medical History:  Diagnosis Date  . Diabetes mellitus without complication ([place])   . Family history of adverse reaction to anesthesia    mom has PONV  . Hypertension   . Impaired fasting glucose 09/2011    Past Surgical History:  Procedure Laterality Date  . COLONOSCOPY WITH PROPOFOL N/A 11/25/2016   Procedure: COLONOSCOPY WITH PROPOFOL;  Surgeon: Daneil Dolin, MD;  [facility];  Service: Endoscopy;  Laterality: N/A;  10:15am  . ENDOMETRIAL ABLATION    . POLYPECTOMY  11/25/2016   Procedure: POLYPECTOMY;  Surgeon: Daneil Dolin, MD;  Location: [facility];  Service: Endoscopy;;  colon  . TONSILLECTOMY    . TUBAL LIGATION  1987     reports  that she has never smoked. She has never used smokeless tobacco. She reports that she does not drink alcohol or use drugs.  No Known Allergies  Family History  Problem Relation Age of Onset  . Cancer Father 50       stomach cancer  . Hypertension Father   . Liver disease Mother        fatty liver, age 42  . Heart disease Maternal Grandmother   . Colon cancer Neg Hx     Prior to Admission medications   Medication Sig Start Date End Date Taking? Authorizing Provider  amLODipine-benazepril (LOTREL) 10-40 MG capsule Take 1 capsule by mouth daily. 01/14/18  Yes [provider]  indapamide (LOZOL) 2.5 MG tablet Take 1 tablet by mouth daily. 01/14/18  Yes [provider]  metFORMIN (GLUCOPHAGE) 500 MG tablet Take by mouth daily.   Yes [provider]  ONE TOUCH ULTRA TEST test strip USE ONE STRIP TO CHECK GLUCOSE UP TO ONCE DAILY 08/25/15  Yes Kathyrn Drown, MD    Physical Exam: Vitals:   04/17/18 1330 04/17/18 1400 04/17/18 1530 04/17/18 1630  BP: 125/78 129/82 140/84 118/69  Pulse: (!) 48 (!) 57 (!) 51 (!) 48  Resp: 11 14 14 14   Temp:      TempSrc:      SpO2: 98% 100% 97% 98%  Weight:      Height:        Constitutional: NAD,  calm, comfortable Vitals:   04/17/18 1330 04/17/18 1400 04/17/18 1530 04/17/18 1630  BP: 125/78 129/82 140/84 118/69  Pulse: (!) 48 (!) 57 (!) 51 (!) 48  Resp: 11 14 14 14   Temp:      TempSrc:      SpO2: 98% 100% 97% 98%  Weight:      Height:       Eyes: PERRL, lids and conjunctivae normal ENMT: Mucous membranes are moist. Posterior pharynx clear of any exudate or lesions. Neck: normal, supple, no masses, no thyromegaly Respiratory: clear to auscultation bilaterally, no wheezing, no crackles. Normal respiratory effort. No accessory muscle use.  Cardiovascular: Regular rate and rhythm, no murmurs / rubs / gallops. No extremity edema. 2+ pedal pulses.  Abdomen: no tenderness, no masses palpated. No hepatosplenomegaly. Bowel  sounds positive.  Musculoskeletal: no clubbing / cyanosis. No joint deformity upper and lower extremities. Good ROM, no contractures. Normal muscle tone.  Skin: no rashes, lesions, ulcers. No induration Neurologic: CN 2-12 grossly intact. Sensation intact, DTR normal. Strength 5/5 in all 4.  Psychiatric: Normal judgment and insight. Alert and oriented x 3. Normal mood.   Labs on Admission: I have personally reviewed following labs and imaging studies  CBC: Recent Labs  Lab 04/17/18 1151  WBC 3.8*  HGB 11.9*  HCT 37.3  MCV 88.0  PLT 161   Basic Metabolic Panel: Recent Labs  Lab 04/17/18 1151  NA 139  K 3.1*  CL 101  CO2 28  GLUCOSE 105*  BUN 25*  CREATININE 0.93  CALCIUM 10.0   Cardiac Enzymes: Recent Labs  Lab 04/17/18 1151 04/17/18 1409  TROPONINI <0.03 0.05*    Radiological Exams on Admission: Dg Chest 2 View  Result Date: 04/17/2018 CLINICAL DATA:  Three-week history of chest pain. Nonsmoker. History of hypertension and diabetes. EXAM: CHEST - 2 VIEW COMPARISON:  None. FINDINGS: The lungs are adequately inflated. There is no focal infiltrate. There is no pleural effusion. The heart and pulmonary vascularity are normal. The mediastinum is normal in width. There is calcification in the wall of the aortic arch. There is mild multilevel degenerative disc disease of the thoracic spine. IMPRESSION: There is no active cardiopulmonary disease. Thoracic aortic atherosclerosis. Electronically Signed   By: David  Martinique M.D.   On: 04/17/2018 12:23    EKG: Independently reviewed.  Sinus rhythm. LVH. LAFB.  QTc 441 normal intervals.   Assessment/Plan Principal Problem:   Chest pain Active Problems:   HTN (hypertension)   Diabetes type 2, uncontrolled (H[facility])  Chest pain- Atypical, Heart score- 4.  Moderate risk with hypertension and diabetes family history.  EKG unremarkable without ST or T wave abnormalities, mild elevated troponin 0.05.  Doubt thromboembolic disease-hx  not consistent, without shortness of breath, no pleurisy, no tachycardia, no risk factors. - UDS - EKG a.m - trops x 2  - Lipid panel a.m - Echo - Cardiology consult- order placed  HypoKalemia- K- 3.1.  Likely from indapamide -Thiazide diuretic. -Check magnesium - Replete - BMP a.m  DM-glucose 105. lAst Hga1c- 11/2017- 5.4 -Hold metformin - SSi- s   HIV as part of routine health screening   DVT prophylaxis: Lovenox Code Status: Full Family Communication: Spouse and daughter at bedside Disposition Plan: 1- 2 days Consults called: Cardiology Admission status: Obs, tele   Bethena Roys MD [facility] Pager [phone] From 3PM-11PM.  Otherwise please contact night-coverage [email or web address] Password [email or web address]  04/17/2018, 5:30 PM

## 2018-04-17 NOTE — ED Provider Notes (Signed)
[facility] EMERGENCY DEPARTMENT Provider Note   CSN: [id number] Arrival date & time: 04/17/18  1132     History   Chief Complaint Chief Complaint  Patient presents with  . Chest Pain    HPI RIELYN KRUPINSKI is a 57 y.o. female.   Chest Pain      Pt was seen at 1255. Per pt, c/o gradual onset and persistence of multiple intermittent episodes of chest "pain" for the past 3 weeks, worse yesterday. Describes the discomfort as mid-sternal chest "pounding" that lasts for 1 to 2 minutes before spontaneously resolving. There has been no specific trigger for her CP, and pt states "it just comes on any time" not necessarily with activity or food. Pt states she took her meds at 2300 last night before work. While she was standing at work, approximately 0200, she began to have chest "pounding." Pt states she left work at [employer] and went home and laid down. Pt states when she woke up at 11am this morning, her chest discomfort was gone. Denies return of chest discomfort since then. Denies palpitations, no SOB/cough, no abd pain, no N/V/D, no back pain, no fevers, no rash, no injury.    Past Medical History:  Diagnosis Date  . Diabetes mellitus without complication ([place])   . Family history of adverse reaction to anesthesia    mom has PONV  . Hypertension   . Impaired fasting glucose 09/2011    Patient Active Problem List   Diagnosis Date Noted  . Left ventricular hypertrophy 01/14/2017  . Colon cancer screening 08/13/2015  . Abnormal LFTs 08/08/2015  . Fatty liver 08/08/2015  . Elevated ferritin 08/08/2015  . Obesity 08/03/2015  . HTN (hypertension) 05/01/2015  . Elevated transaminase level 05/01/2015  . Hyperlipidemia 05/01/2015  . Diabetes type 2, uncontrolled ([place]) 05/01/2015    Past Surgical History:  Procedure Laterality Date  . COLONOSCOPY WITH PROPOFOL N/A 11/25/2016   Procedure: COLONOSCOPY WITH PROPOFOL;  Surgeon: Daneil Dolin, MD;  [facility];  Service:  Endoscopy;  Laterality: N/A;  10:15am  . ENDOMETRIAL ABLATION    . POLYPECTOMY  11/25/2016   Procedure: POLYPECTOMY;  Surgeon: Daneil Dolin, MD;  Location[facility];  Service: Endoscopy;;  colon  . TONSILLECTOMY    . TUBAL LIGATION  1987     OB History   None      Home Medications    Prior to Admission medications   Medication Sig Start Date End Date Taking? Authorizing Provider  amLODipine-benazepril (LOTREL) 10-40 MG capsule Take 1 capsule by mouth daily. 01/14/18  Yes [provider]  indapamide (LOZOL) 2.5 MG tablet Take 1 tablet by mouth daily. 01/14/18  Yes [provider]  metFORMIN (GLUCOPHAGE) 500 MG tablet Take by mouth daily.   Yes [provider]  ONE TOUCH ULTRA TEST test strip USE ONE STRIP TO CHECK GLUCOSE UP TO ONCE DAILY 08/25/15  Yes Kathyrn Drown, MD    Family History Family History  Problem Relation Age of Onset  . Cancer Father 35       stomach cancer  . Hypertension Father   . Liver disease Mother        fatty liver, age 41  . Heart disease Maternal Grandmother   . Colon cancer Neg Hx     Social History Social History   Tobacco Use  . Smoking status: Never Smoker  . Smokeless tobacco: Never Used  Substance Use Topics  . Alcohol use: No  .  Drug use: No     Allergies   Patient has no known allergies.   Review of Systems Review of Systems  Cardiovascular: Positive for chest pain.  ROS: Statement: All systems negative except as marked or noted in the HPI; Constitutional: Negative for fever and chills. ; ; Eyes: Negative for eye pain, redness and discharge. ; ; ENMT: Negative for ear pain, hoarseness, nasal congestion, sinus pressure and sore throat. ; ; Cardiovascular: +CP. Negative for palpitations, diaphoresis, dyspnea and peripheral edema. ; ; Respiratory: Negative for cough, wheezing and stridor. ; ; Gastrointestinal: Negative for nausea, vomiting, diarrhea, abdominal pain, blood in stool, hematemesis,  jaundice and rectal bleeding. . ; ; Genitourinary: Negative for dysuria, flank pain and hematuria. ; ; Musculoskeletal: Negative for back pain and neck pain. Negative for swelling and trauma.; ; Skin: Negative for pruritus, rash, abrasions, blisters, bruising and skin lesion.; ; Neuro: Negative for headache, lightheadedness and neck stiffness. Negative for weakness, altered level of consciousness, altered mental status, extremity weakness, paresthesias, involuntary movement, seizure and syncope.        Physical Exam Updated Vital Signs BP 114/79   Pulse (!) 56   Temp 98.2 F (36.8 C) (Oral)   Resp 11   Ht 5\' 6"  (1.676 m)   Wt 95.3 kg   SpO2 98%   BMI 33.89 kg/m   Physical Exam 1300: Physical examination:  Nursing notes reviewed; Vital signs and O2 SAT reviewed;  Constitutional: Well developed, Well nourished, Well hydrated, In no acute distress; Head:  Normocephalic, atraumatic; Eyes: EOMI, PERRL, No scleral icterus; ENMT: Mouth and pharynx normal, Mucous membranes moist; Neck: Supple, Full range of motion, No lymphadenopathy; Cardiovascular: Regular rate and rhythm, No gallop; Respiratory: Breath sounds clear & equal bilaterally, No wheezes.  Speaking full sentences with ease, Normal respiratory effort/excursion; Chest: Nontender, Movement normal; Abdomen: Soft, Nontender, Nondistended, Normal bowel sounds; Genitourinary: No CVA tenderness; Extremities: Peripheral pulses normal, No tenderness, No edema, No calf edema or asymmetry.; Neuro: AA&Ox3, Major CN grossly intact.  Speech clear. No gross focal motor or sensory deficits in extremities.; Skin: Color normal, Warm, Dry.   ED Treatments / Results  Labs (all labs ordered are listed, but only abnormal results are displayed)   EKG EKG Interpretation  Date/Time:  Monday April 17 2018 11:44:51 EDT Ventricular Rate:  68 PR Interval:    QRS Duration: 96 QT Interval:  414 QTC Calculation: 441 R Axis:   -43 Text Interpretation:   Sinus rhythm Left anterior fascicular block Abnormal R-wave progression, late transition Left ventricular hypertrophy No significant change since last tracing Confirmed by Orlie Dakin [phone]) on 04/17/2018 12:23:42 PM   Radiology   Procedures Procedures (including critical care time)  Medications Ordered in ED Medications - No data to display   Initial Impression / Assessment and Plan / ED Course  I have reviewed the triage vital signs and the nursing notes.  Pertinent labs & imaging results that were available during my care of the patient were reviewed by me and considered in my medical decision making (see chart for details).  MDM Reviewed: previous chart, nursing note and vitals Reviewed previous: labs and ECG Interpretation: labs, ECG and x-ray   Results for orders placed or performed during the hospital encounter of 27/25/36  Basic metabolic panel  Result Value Ref Range   Sodium 139 135 - 145 mmol/L   Potassium 3.1 (L) 3.5 - 5.1 mmol/L   Chloride 101 98 - 111 mmol/L   CO2 28 22 -  32 mmol/L   Glucose, Bld 105 (H) 70 - 99 mg/dL   BUN 25 (H) 6 - 20 mg/dL   Creatinine, Ser 0.93 0.44 - 1.00 mg/dL   Calcium 10.0 8.9 - 10.3 mg/dL   GFR calc non Af Amer >60 >60 mL/min   GFR calc Af Amer >60 >60 mL/min   Anion gap 10 5 - 15  CBC  Result Value Ref Range   WBC 3.8 (L) 4.0 - 10.5 K/uL   RBC 4.24 3.87 - 5.11 MIL/uL   Hemoglobin 11.9 (L) 12.0 - 15.0 g/dL   HCT 37.3 36.0 - 46.0 %   MCV 88.0 80.0 - 100.0 fL   MCH 28.1 26.0 - 34.0 pg   MCHC 31.9 30.0 - 36.0 g/dL   RDW 14.1 11.5 - 15.5 %   Platelets 259 150 - 400 K/uL   nRBC 0.0 0.0 - 0.2 %  Troponin I  Result Value Ref Range   Troponin I <0.03 <0.03 ng/mL  Troponin I  Result Value Ref Range   Troponin I 0.05 (HH) <0.03 ng/mL   Dg Chest 2 View Result Date: 04/17/2018 CLINICAL DATA:  Three-week history of chest pain. Nonsmoker. History of hypertension and diabetes. EXAM: CHEST - 2 VIEW COMPARISON:  None. FINDINGS:  The lungs are adequately inflated. There is no focal infiltrate. There is no pleural effusion. The heart and pulmonary vascularity are normal. The mediastinum is normal in width. There is calcification in the wall of the aortic arch. There is mild multilevel degenerative disc disease of the thoracic spine. IMPRESSION: There is no active cardiopulmonary disease. Thoracic aortic atherosclerosis. Electronically Signed   By: David  Martinique M.D.   On: 04/17/2018 12:23     1530:  Doubt PE as cause for symptoms with low risk [place]. Delta troponin with mild elevation, Heart score 2; will observation admit. ASA given. Potassium repleted PO. Dx and testing d/w pt.  Questions answered.  Verb understanding, agreeable to admit.  T/C returned from [facility]. Denton Brick, case discussed, including:  HPI, pertinent PM/SHx, VS/PE, dx testing, ED course and treatment:  Agreeable to admit.       Final Clinical Impressions(s) / ED Diagnoses   Final diagnoses:  None    ED Discharge Orders    None       Francine Graven, DO 04/18/18 2046

## 2018-04-18 ENCOUNTER — Observation Stay (HOSPITAL_BASED_OUTPATIENT_CLINIC_OR_DEPARTMENT_OTHER): Payer: BLUE CROSS/BLUE SHIELD

## 2018-04-18 ENCOUNTER — Encounter (HOSPITAL_COMMUNITY): Payer: Self-pay | Admitting: Student

## 2018-04-18 DIAGNOSIS — R079 Chest pain, unspecified: Secondary | ICD-10-CM

## 2018-04-18 LAB — NM MYOCAR MULTI W/SPECT W/WALL MOTION / EF
CHL CUP NUCLEAR SDS: 0
CHL CUP NUCLEAR SRS: 2
CSEPPHR: 92 {beats}/min
LV dias vol: 80 mL (ref 46–106)
LV sys vol: 27 mL
RATE: 0.43
Rest HR: 49 {beats}/min
SSS: 2
TID: 0.82

## 2018-04-18 LAB — LIPID PANEL
Cholesterol: 182 mg/dL (ref 0–200)
HDL: 51 mg/dL (ref >40)
LDL CALC: 112 mg/dL — AB (ref 0–99)
Total CHOL/HDL Ratio: 3.6 RATIO
Triglycerides: 93 mg/dL (ref <150)
VLDL: 19 mg/dL (ref 0–40)

## 2018-04-18 LAB — GLUCOSE, CAPILLARY
GLUCOSE-CAPILLARY: 89 mg/dL (ref 70–99)
GLUCOSE-CAPILLARY: 113 mg/dL — AB (ref 70–99)
Glucose-Capillary: 90 mg/dL (ref 70–99)

## 2018-04-18 LAB — ECHOCARDIOGRAM COMPLETE
HEIGHTINCHES: 66 in
WEIGHTICAEL: 3545 [oz_av]

## 2018-04-18 LAB — BASIC METABOLIC PANEL
Anion gap: 7 (ref 5–15)
BUN: 22 mg/dL — ABNORMAL HIGH (ref 6–20)
CALCIUM: 9.8 mg/dL (ref 8.9–10.3)
CO2: 27 mmol/L (ref 22–32)
Chloride: 103 mmol/L (ref 98–111)
Creatinine, Ser: 0.83 mg/dL (ref 0.44–1.00)
GFR calc Af Amer: >60 mL/min (ref >60)
GFR calc non Af Amer: >60 mL/min (ref >60)
GLUCOSE: 98 mg/dL (ref 70–99)
Potassium: 3.5 mmol/L (ref 3.5–5.1)
Sodium: 137 mmol/L (ref 135–145)

## 2018-04-18 LAB — HIV ANTIBODY (ROUTINE TESTING W REFLEX): HIV SCREEN 4TH GENERATION: NONREACTIVE

## 2018-04-18 LAB — TROPONIN I

## 2018-04-18 LAB — TSH: TSH: 1.092 u[IU]/mL (ref 0.350–4.500)

## 2018-04-18 MED ORDER — TECHNETIUM TC 99M TETROFOSMIN IV KIT
30.0000 | PACK | Freq: Once | INTRAVENOUS | Status: AC | PRN
  Administered 2018-04-18: 9.28 via INTRAVENOUS

## 2018-04-18 MED ORDER — SODIUM CHLORIDE 0.9% FLUSH
INTRAVENOUS | Status: AC
  Administered 2018-04-18: 10 mL via INTRAVENOUS
  Filled 2018-04-18: qty 10

## 2018-04-18 MED ORDER — ASPIRIN EC 81 MG PO TBEC
81.0000 mg | DELAYED_RELEASE_TABLET | Freq: Every day | ORAL | Status: DC
  Administered 2018-04-18: 81 mg via ORAL
  Filled 2018-04-18: qty 1

## 2018-04-18 MED ORDER — TECHNETIUM TC 99M TETROFOSMIN IV KIT
10.0000 | PACK | Freq: Once | INTRAVENOUS | Status: AC | PRN
  Administered 2018-04-18: 30 via INTRAVENOUS

## 2018-04-18 MED ORDER — REGADENOSON 0.4 MG/5ML IV SOLN
INTRAVENOUS | Status: AC
  Administered 2018-04-18: 0.4 mg via INTRAVENOUS
  Filled 2018-04-18: qty 5

## 2018-04-18 NOTE — Consult Note (Addendum)
Cardiology Consult    [name]; [id number]; 10-01-1960   Admit date: 04/17/2018 Date of Consult: 04/18/2018  Primary Care Provider: Kathyrn Drown, MD Primary Cardiologist: New to [facility] - Dr. Harl Bowie  Tammy Profile    [name] is a 57 y.o. female with past medical history of HTN and Type 2 DM who is being seen today for the evaluation of chest pain at the request of Dr. Denton Brick.   History of Present Illness    [name] reports being in her usual state of health until approximately 3 weeks ago when she started having intermittent episodes of palpitations and chest heaviness. She describes this as her heart racing and symptoms can last for a few minutes up to hours at a time. Notes associated chest discomfort when this occurs. Denies any associated lightheadedness, dizziness, presyncope, dyspnea on exertion, orthopnea, PND, or lower extremity edema. Symptoms can occur at rest or with activity. She did notice symptoms were occurring after consuming her daily cup of coffee and she switch to decaffeinated coffee but still experienced symptoms.  She denies any personal history of CAD or cardiac arrhythmias. Does have HTN and Type 2 DM. Reports her father has known CAD and required stent placement in the past. She denies any personal tobacco use or alcohol use.  Initial labs show WBC 3.8, Hgb 11.9, platelets 259, Na+ 139, K+ 3.1, and creatinine 0.93. Initial troponin < 0.03 with repeat values of 0.05, < 0.03, and < 0.03. CXR shows no active cardiopulmonary disease. EKG on admission shows NSR, HR 68, LAD, LVH and no acute ST changes when compared to prior tracings from 11/2016. Has been followed on telemetry overnight and has been in sinus bradycardia, HR in mid-40's to 50's with no significant arrhythmias or pauses noted.    Past Medical History:  Diagnosis Date  . Diabetes mellitus without complication ([facility])   . Family history of adverse reaction to anesthesia    mom has  PONV  . Hypertension   . Impaired fasting glucose 09/2011    Past Surgical History:  Procedure Laterality Date  . COLONOSCOPY WITH PROPOFOL N/A 11/25/2016   Procedure: COLONOSCOPY WITH PROPOFOL;  Surgeon: Daneil Dolin, MD;  Location: [facility];  Service: Endoscopy;  Laterality: N/A;  10:15am  . ENDOMETRIAL ABLATION    . POLYPECTOMY  11/25/2016   Procedure: POLYPECTOMY;  Surgeon: Daneil Dolin, MD;  Location: [facility];  Service: Endoscopy;;  colon  . TONSILLECTOMY    . TUBAL LIGATION  1987     Home Medications:  Prior to Admission medications   Medication Sig Start Date End Date Taking? Authorizing Provider  amLODipine-benazepril (LOTREL) 10-40 MG capsule Take 1 capsule by mouth daily. 01/14/18  Yes [provider]  indapamide (LOZOL) 2.5 MG tablet Take 1 tablet by mouth daily. 01/14/18  Yes [provider]  metFORMIN (GLUCOPHAGE) 500 MG tablet Take by mouth daily.   Yes [provider]  ONE TOUCH ULTRA TEST test strip USE ONE STRIP TO CHECK GLUCOSE UP TO ONCE DAILY 08/25/15  Yes Kathyrn Drown, MD    Inpatient Medications: Scheduled Meds: . benazepril  40 mg Oral Daily   And  . amLODipine  10 mg Oral Daily  . enoxaparin (LOVENOX) injection  40 mg Subcutaneous Q24H  . indapamide  2.5 mg Oral Daily  . insulin aspart  0-9 Units Subcutaneous TID WC   Continuous Infusions:  PRN Meds: acetaminophen **OR** acetaminophen, ondansetron **  OR** ondansetron (ZOFRAN) IV, polyethylene glycol  Allergies:   No Known Allergies  Social History:   Social History   Socioeconomic History  . Marital status: Married    Spouse name: Not on file  . Number of children: 3  . Years of education: Not on file  . Highest education level: Not on file  Occupational History  . Not on file  Social Needs  . Financial resource strain: Not on file  . Food insecurity:    Worry: Not on file    Inability: Not on file  . Transportation needs:    Medical: Not on file      Non-medical: Not on file  Tobacco Use  . Smoking status: Never Smoker  . Smokeless tobacco: Never Used  Substance and Sexual Activity  . Alcohol use: No  . Drug use: No  . Sexual activity: Yes    Birth control/protection: Post-menopausal, Surgical  Lifestyle  . Physical activity:    Days per week: Not on file    Minutes per session: Not on file  . Stress: Not on file  Relationships  . Social connections:    Talks on phone: Not on file    Gets together: Not on file    Attends religious service: Not on file    Active member of club or organization: Not on file    Attends meetings of clubs or organizations: Not on file    Relationship status: Not on file  . Intimate partner violence:    Fear of current or ex partner: Not on file    Emotionally abused: Not on file    Physically abused: Not on file    Forced sexual activity: Not on file  Other Topics Concern  . Not on file  Social History Narrative  . Not on file     Family History:    Family History  Problem Relation Age of Onset  . Cancer Father 48       stomach cancer  . Hypertension Father   . Liver disease Mother        fatty liver, age 44  . Heart disease Maternal Grandmother   . Colon cancer Neg Hx       Review of Systems    General:  No chills, fever, night sweats or weight changes.  Cardiovascular:  No edema, orthopnea, palpitations, paroxysmal nocturnal dyspnea. Positive for palpitations and chest discomfort.  Dermatological: No rash, lesions/masses Respiratory: No cough, dyspnea Urologic: No hematuria, dysuria Abdominal:   No nausea, vomiting, diarrhea, bright red blood per rectum, melena, or hematemesis Neurologic:  No visual changes, wkns, changes in mental status. All other systems reviewed and are otherwise negative except as noted above.  Physical Exam/Data    Vitals:   04/17/18 1830 04/17/18 1830 04/17/18 2103 04/18/18 0532  BP:  133/82 119/73 97/68  Pulse:  (!) 50 (!) 59 (!) 50  Resp:   18 18 16   Temp:  98.4 F (36.9 C) 98.6 F (37 C) 97.9 F (36.6 C)  TempSrc:  Oral Oral Oral  SpO2:  99% 98% 99%  Weight: 100.5 kg     Height: 5\' 6"  (1.676 m)       Intake/Output Summary (Last 24 hours) at 04/18/2018 0852 Last data filed at 04/17/2018 1847 Gross per 24 hour  Intake 480 ml  Output -  Net 480 ml   Filed Weights   04/17/18 1150 04/17/18 1830  Weight: 95.3 kg 100.5 kg   Body mass index is  35.76 kg/m.   General: Pleasant, African American female appearing in NAD Psych: Normal affect. Neuro: Alert and oriented X 3. Moves all extremities spontaneously. HEENT: Normal  Neck: Supple without bruits or JVD. Lungs:  Resp regular and unlabored, CTA without wheezing or rales. Heart: RRR no s3, s4, or murmurs. Abdomen: Soft, non-tender, non-distended, BS + x 4.  Extremities: No clubbing, cyanosis or lower extremity edema. DP/PT/Radials 2+ and equal bilaterally.    Labs/Studies     Relevant CV Studies:  Echocardiogram: 12/2016 Study Conclusions  - Left ventricle: The cavity size was normal. Wall thickness was   increased in a pattern of mild LVH. Systolic function was normal.   The estimated ejection fraction was in the range of 60% to 65%.   Wall motion was normal; there were no regional wall motion   abnormalities. Left ventricular diastolic function parameters   were normal. - Aortic valve: Valve area (VTI): 3.57 cm^2. Valve area (Vmax):   3.67 cm^2. - Systemic veins: IVC is small, suggesting low RA pressure and   hypovolemia.  Laboratory Data:  Chemistry Recent Labs  Lab 04/17/18 1151 04/18/18 0209  NA 139 137  K 3.1* 3.5  CL 101 103  CO2 28 27  GLUCOSE 105* 98  BUN 25* 22*  CREATININE 0.93 0.83  CALCIUM 10.0 9.8  GFRNONAA >60 >60  GFRAA >60 >60  ANIONGAP 10 7    No results for input(s): PROT, ALBUMIN, AST, ALT, ALKPHOS, BILITOT in the last 168 hours. Hematology Recent Labs  Lab 04/17/18 1151  WBC 3.8*  RBC 4.24  HGB 11.9*  HCT  37.3  MCV 88.0  MCH 28.1  MCHC 31.9  RDW 14.1  PLT 259   Cardiac Enzymes Recent Labs  Lab 04/17/18 1151 04/17/18 1409 04/17/18 1941 04/18/18 0209  TROPONINI <0.03 0.05* <0.03 <0.03   No results for input(s): TROPIPOC in the last 168 hours.  BNPNo results for input(s): BNP, PROBNP in the last 168 hours.  DDimer No results for input(s): DDIMER in the last 168 hours.  Radiology/Studies:  Dg Chest 2 View  Result Date: 04/17/2018 CLINICAL DATA:  Three-week history of chest pain. Nonsmoker. History of hypertension and diabetes. EXAM: CHEST - 2 VIEW COMPARISON:  None. FINDINGS: The lungs are adequately inflated. There is no focal infiltrate. There is no pleural effusion. The heart and pulmonary vascularity are normal. The mediastinum is normal in width. There is calcification in the wall of the aortic arch. There is mild multilevel degenerative disc disease of the thoracic spine. IMPRESSION: There is no active cardiopulmonary disease. Thoracic aortic atherosclerosis. Electronically Signed   By: David  Martinique M.D.   On: 04/17/2018 12:23     Assessment & Plan    1. Palpitations/ Chest Pain - presents with a 3 week history of new-onset intermittent palpitations which she describes as her heart racing. Experiences associated chest heaviness and feeling like she cannot take a deep breath when this occurs but denies any associated dyspnea, lightheadedness, dizziness, or presyncope.  - K+ 3.1. Mg 1.9. TSH followed by PCP and WNL with most recent labs. Initial troponin < 0.03 with repeat values of 0.05, < 0.03, and < 0.03. EKG on admission shows NSR, HR 68, LAD, LVH and no acute ST changes when compared to prior tracings from 11/2016. Repeat this AM shows NSR with mild TWI along V1-V2. Telemetry showing sinus bradycardia, HR in mid-40's to 50's with no significant arrhythmias or pauses noted. - Echocardiogram is pending to assess LV function and  wall motion. Overall, her symptoms seem atypical for  ACS and most concerning for an arrhythmia. If telemetry is unrevealing this admission, would recommend an event monitor as an outpatient. Can also consider ischemic evaluation with a NST pending echo results. Will discuss with Dr. Harl Bowie in regards to inpatient versus outpatient evaluation.   2. HTN - BP has been well controlled at 97/68 - 140/87 since admission. - continue PTA Amlodipine-Benazepril 10-40mg  daily.   3. Type 2 DM - Hemoglobin A1c at 5.8 in 08/2017. Followed by PCP. On Metformin as an outpatient.  4. Hypokalemia - K+ 3.1 at time of admission. Replaced and at 3.5 this AM.   For questions or updates, please contact [facility] Please consult [email or web address] for contact info under Cardiology/STEMI.  Signed, Erma Heritage, PA-C 04/18/2018, 8:52 AM Pager: [phone]   Attending note Tammy seen and discussed with PA Ahmed Prima, I agree with her documentation above. 57 yo female history of HL, DM2, HTN admitted with chest pain. Symptoms started about 3-4 weeks ago. Pressure left chest with palpitations, can have some diaphoresis and nausea. Sometimes better with positoin change. Variable in duration, at times can last several hours in a row. Has been progressing in frequency and severity since onset.   K 3.1 Cr 0.93 WBC 3.8 Hgb 11.9 Plt 259 Mg 1.9 LDL 112 Trop neg -->0.05-->neg-->neg EKG SR, LAFB. Repeat EKG this AM mild anterior TWIs Echo pending. Early view shows normal LVEF without WMAs.   Tammy presents with chest pain and palpitations. Mixed symptoms. She has a nonspecific troponin trend, nonspecific EKG patterns including mild new anterior TWIs this AM. Symptoms suggest possible symptomatic arrhtymia vs atypical angina, given her gender and DM2 history certainly at risk for atypical anginal symptoms. Preliminary review of echo shows normal LVEF without significant WMAs.  Plan for [place] today, if negative would plan for home event monitor. Current tele with  asymptomatic sinus brady, she is not on any av nodal agents, check TSH.    Zandra Abts MD

## 2018-04-18 NOTE — Progress Notes (Signed)
Patient states understanding of discharge instructions.  

## 2018-04-18 NOTE — Discharge Summary (Signed)
Physician Discharge Summary  [name] SHF:[id number] DOB: 1961/05/29 DOA: 04/17/2018  PCP: Kathyrn Drown, MD  Admit date: 04/17/2018  Discharge date: 04/18/2018  Admitted From:Home  Disposition:  Home  Recommendations for Outpatient Follow-up:  1. Follow up with PCP in 1-2 weeks 2. Will be set up with Holter monitor for cardiology and follow-up outpatient  Home Health: None  Equipment/Devices: None  Discharge Condition: Stable  CODE STATUS: Full  Diet recommendation: Heart Healthy  Brief/Interim Summary: Per HPI from Dr. Denton Esparza on 10/28: [name] is a 57 y.o. female with medical history significant for HLD, DM, fatty liver, HTN, presented to the ED with complaints of chest pain intermittent over the past 3 weeks lasting 1 to 2 minutes described as a heaviness in her mid chest.  Pain is associated with palpitations and diaphoresis.  Chest pain is unrelated to activity or food, not worse with deep breathing. She denies associated shortness of breath.  She denies recent travel, personal or family history of blood clots.  Denies leg pain or swelling. Never smoker.  History of heart attacks in her father and grandmother, both in their 22s.  She denied any further episodes of chest pain during this admission and was seen by cardiology with recommendations for stress test.  She underwent the stress test with no acute findings noted and LVEF greater than 65%.  Echo demonstrated the same.  She will be discharged with Holter monitor and cardiology follow-up.  Continue other medications at home as otherwise prescribed.  No other acute events have been noted during the course of this brief admission.  Discharge Diagnoses:  Principal Problem:   Chest pain Active Problems:   HTN (hypertension)   Diabetes type 2, uncontrolled ([facility])    Discharge Instructions  Discharge Instructions    Diet - low sodium heart healthy   Complete by:  As directed    Increase activity slowly    Complete by:  As directed      Allergies as of 04/18/2018   No Known Allergies     Medication List    TAKE these medications   amLODipine-benazepril 10-40 MG capsule Commonly known as:  LOTREL Take 1 capsule by mouth daily.   indapamide 2.5 MG tablet Commonly known as:  LOZOL Take 1 tablet by mouth daily.   metFORMIN 500 MG tablet Commonly known as:  GLUCOPHAGE Take by mouth daily.   ONE TOUCH ULTRA TEST test strip Generic drug:  glucose blood USE ONE STRIP TO CHECK GLUCOSE UP TO ONCE DAILY      Follow-up Information    Kathyrn Drown, MD Follow up in 2 week(s).   Specialty:  Family Medicine Contact information: [address] [place] [address] [phone]          No Known Allergies  Consultations:  Cardiology Dr. Harl Bowie   Procedures/Studies: Dg Chest 2 View  Result Date: 04/17/2018 CLINICAL DATA:  Three-week history of chest pain. Nonsmoker. History of hypertension and diabetes. EXAM: CHEST - 2 VIEW COMPARISON:  None. FINDINGS: The lungs are adequately inflated. There is no focal infiltrate. There is no pleural effusion. The heart and pulmonary vascularity are normal. The mediastinum is normal in width. There is calcification in the wall of the aortic arch. There is mild multilevel degenerative disc disease of the thoracic spine. IMPRESSION: There is no active cardiopulmonary disease. Thoracic aortic atherosclerosis. Electronically Signed   By: David  Martinique M.D.   On: 04/17/2018 12:23   Nm Myocar  Multi W/spect W/wall Motion / Ef  Result Date: 04/18/2018  There was no ST segment deviation noted during stress.  The study is normal. Inferior/inferoapical defects consistent with gut radiotracer uptake artifact.  This is a low risk study.  The left ventricular ejection fraction is hyperdynamic (>65%).      Discharge Exam: Vitals:   04/18/18 0906 04/18/18 1259  BP: 124/81 97/66  Pulse: (!) 49 60  Resp:  18  Temp: 98 F (36.7 C) 98.2  F (36.8 C)  SpO2: 98% 99%   Vitals:   04/17/18 2103 04/18/18 0532 04/18/18 0906 04/18/18 1259  BP: 119/73 97/68 124/81 97/66  Pulse: (!) 59 (!) 50 (!) 49 60  Resp: 18 16  18   Temp: 98.6 F (37 C) 97.9 F (36.6 C) 98 F (36.7 C) 98.2 F (36.8 C)  TempSrc: Oral Oral Oral Oral  SpO2: 98% 99% 98% 99%  Weight:      Height:        General: Pt is alert, awake, not in acute distress Cardiovascular: RRR, S1/S2 +, no rubs, no gallops Respiratory: CTA bilaterally, no wheezing, no rhonchi Abdominal: Soft, NT, ND, bowel sounds + Extremities: no edema, no cyanosis    The results of significant diagnostics from this hospitalization (including imaging, microbiology, ancillary and laboratory) are listed below for reference.     Microbiology: No results found for this or any previous visit (from the past 240 hour(s)).   Labs: BNP (last 3 results) No results for input(s): BNP in the last 8760 hours. Basic Metabolic Panel: Recent Labs  Lab 04/17/18 1151 04/17/18 1945 04/18/18 0209  NA 139  --  137  K 3.1*  --  3.5  CL 101  --  103  CO2 28  --  27  GLUCOSE 105*  --  98  BUN 25*  --  22*  CREATININE 0.93  --  0.83  CALCIUM 10.0  --  9.8  MG  --  1.9  --    Liver Function Tests: No results for input(s): AST, ALT, ALKPHOS, BILITOT, PROT, ALBUMIN in the last 168 hours. No results for input(s): LIPASE, AMYLASE in the last 168 hours. No results for input(s): AMMONIA in the last 168 hours. CBC: Recent Labs  Lab 04/17/18 1151  WBC 3.8*  HGB 11.9*  HCT 37.3  MCV 88.0  PLT 259   Cardiac Enzymes: Recent Labs  Lab 04/17/18 1151 04/17/18 1409 04/17/18 1941 04/18/18 0209  TROPONINI <0.03 0.05* <0.03 <0.03   BNP: Invalid input(s): POCBNP CBG: Recent Labs  Lab 04/18/18 0713 04/18/18 1106  GLUCAP 89 90   D-Dimer No results for input(s): DDIMER in the last 72 hours. Hgb A1c No results for input(s): HGBA1C in the last 72 hours. Lipid Profile Recent Labs     04/18/18 0209  CHOL 182  HDL 51  LDLCALC 112*  TRIG 93  CHOLHDL 3.6   Thyroid function studies Recent Labs    04/18/18 1242  TSH 1.092   Anemia work up No results for input(s): VITAMINB12, FOLATE, FERRITIN, TIBC, IRON, RETICCTPCT in the last 72 hours. Urinalysis    Component Value Date/Time   COLORURINE YELLOW 11/13/2011 0601   APPEARANCEUR CLEAR 11/13/2011 0601   LABSPEC >1.030 (H) 11/13/2011 0601   PHURINE 5.5 11/13/2011 0601   GLUCOSEU NEGATIVE 11/13/2011 0601   HGBUR NEGATIVE 11/13/2011 0601   BILIRUBINUR NEGATIVE 11/13/2011 0601   KETONESUR NEGATIVE 11/13/2011 0601   PROTEINUR 30 (A) 11/13/2011 0601   UROBILINOGEN 0.2 11/13/2011 0601  NITRITE NEGATIVE 11/13/2011 0601   LEUKOCYTESUR NEGATIVE 11/13/2011 0601   Sepsis Labs Invalid input(s): PROCALCITONIN,  WBC,  LACTICIDVEN Microbiology No results found for this or any previous visit (from the past 240 hour(s)).   Time coordinating discharge: 35 minutes  SIGNED:   Rodena Goldmann, DO [facility] 04/18/2018, 4:49 PM Pager [phone]  If 7PM-7AM, please contact night-coverage [email or web address] Password [email or web address]

## 2018-04-18 NOTE — Progress Notes (Signed)
*  PRELIMINARY RESULTS* Echocardiogram 2D Echocardiogram has been performed.  [name] 04/18/2018, 10:09 AM

## 2018-04-21 ENCOUNTER — Encounter: Payer: Self-pay | Admitting: Family Medicine

## 2018-04-21 ENCOUNTER — Ambulatory Visit: Payer: BLUE CROSS/BLUE SHIELD | Admitting: Family Medicine

## 2018-04-21 VITALS — BP 136/88 | Ht 66.0 in | Wt 222.0 lb

## 2018-04-21 DIAGNOSIS — I10 Essential (primary) hypertension: Secondary | ICD-10-CM | POA: Diagnosis not present

## 2018-04-21 MED ORDER — PANTOPRAZOLE SODIUM 40 MG PO TBEC: 40.0000 mg | DELAYED_RELEASE_TABLET | Freq: Every day | ORAL | 2 refills | Status: DC

## 2018-04-21 MED ORDER — POTASSIUM CHLORIDE ER 10 MEQ PO TBCR: EXTENDED_RELEASE_TABLET | ORAL | 5 refills | Status: DC

## 2018-04-21 NOTE — Progress Notes (Signed)
   Subjective:    Patient ID: [name], female    DOB: Jul 29, 1960, 57 y.o.   MRN: [id number]  HPI Pt here today for hospital follow up. Pt went to ED for chest pain. Pt states she is still having chest pain. Pt states she is having pain in middle of chest like something is sitting on chest. Pt states the ER said her potassium was low and she was given potassium pills in hospital but no script.    She is having some intermittent chest discomforts that she describes as an ache that comes and goes lasts only a few minutes at a time does not radiate down her leg not associated with activity and she denies any throat pain she denies any regurgitation issues.  Patient was in the hospital with chest pain she had evaluation done including a stress test that was negative her potassium was low she relates compliance with her medicines she denies any other particular troubles currently. Review of Systems  Constitutional: Negative for activity change and appetite change.  HENT: Negative for congestion and rhinorrhea.   Respiratory: Negative for cough and shortness of breath.   Cardiovascular: Negative for chest pain and leg swelling.  Gastrointestinal: Negative for abdominal pain, nausea and vomiting.  Skin: Negative for color change.  Neurological: Negative for dizziness and weakness.  Psychiatric/Behavioral: Negative for agitation and confusion.       Objective:   Physical Exam  Constitutional: She appears well-nourished. No distress.  HENT:  Head: Normocephalic and atraumatic.  Eyes: Right eye exhibits no discharge. Left eye exhibits no discharge.  Neck: No tracheal deviation present.  Cardiovascular: Normal rate, regular rhythm and normal heart sounds.  No murmur heard. Pulmonary/Chest: Effort normal and breath sounds normal. No respiratory distress.  Musculoskeletal: She exhibits no edema.  Lymphadenopathy:    She has no cervical adenopathy.  Neurological: She is alert. Coordination  normal.  Skin: Skin is warm and dry.  Psychiatric: She has a normal mood and affect. Her behavior is normal.  Vitals reviewed.         Assessment & Plan:  Possible gastritis with reflux issues I would recommend trying Protonix on a daily basis over the next couple months if not dramatic improvement within the next 2 weeks to notify us apparently at her workplace have a nurse practitioner who manages all other health problems I advised this patient if she has ongoing trouble with this chest pain over the next 2 weeks either to follow-up here or follow-up with her nurse practitioner  Potassium borderline we will go ahead and initiate low-dose potassium 10 mEq a day plus also repeat met 7 in 2 weeks time  Follow-up in 6 months  Thoracic and aortosclerosis noted recommend patient to do low fat low fried diet follow cholesterol on a regular basis hold off on medicine currently

## 2018-04-23 ENCOUNTER — Telehealth: Payer: Self-pay | Admitting: Family Medicine

## 2018-04-23 NOTE — Telephone Encounter (Signed)
Saw the patient the other day and follow-up regarding her hospitalization I see where cardiology recommended an event monitor Please connect with cardiology make sure that they are setting her up for this She saw Dr. Harl Bowie during this hospitalization

## 2018-04-24 ENCOUNTER — Telehealth: Payer: Self-pay | Admitting: *Deleted

## 2018-04-24 ENCOUNTER — Telehealth: Payer: Self-pay | Admitting: Student

## 2018-04-24 NOTE — Telephone Encounter (Signed)
Patient is aware 

## 2018-04-24 NOTE — Telephone Encounter (Signed)
-----   Message from Erma Heritage, [place] sent at 04/18/2018  5:23 PM EDT ----- Regarding: Outpatient Monitor This patient is being discharged from the hospital today and needs a 2-week event monitor for palpitations.  Thanks for your help!  - [place]

## 2018-04-24 NOTE — Telephone Encounter (Signed)
Please let the patient know that Dr. Nelly Laurence office per their discussion during the hospitalization will be setting her up for telemetry with a follow-up visit Based on the information I am reading I believe this is a reasonable thing for her to do and I recommend for her to follow through on

## 2018-04-24 NOTE — Telephone Encounter (Signed)
I called and spoke with Jarrett Soho whom answered the phone and she states there is no order for one in the system,but they may do it when pt comes in for her appointment. I asked if I could speak with a nurse and she states they only had one and could not pull her away. I asked if she would send a note back to the Dr. Or Nurse asking if they plan on doing one she states she will send a note back to [place] asking her about this.

## 2018-04-24 NOTE — Telephone Encounter (Signed)
Per Varney Biles with Dr. Nelly Laurence office she is setting this up and they will be mailing information to the pt.

## 2018-04-24 NOTE — Telephone Encounter (Signed)
Office notified that pt has been enrolled in [employer]

## 2018-04-24 NOTE — Telephone Encounter (Signed)
Dr. Malachy Moan office called wanting to know if we had ordered this pt an event monitor yet?

## 2018-04-26 ENCOUNTER — Ambulatory Visit (INDEPENDENT_AMBULATORY_CARE_PROVIDER_SITE_OTHER): Payer: BLUE CROSS/BLUE SHIELD

## 2018-04-26 DIAGNOSIS — R002 Palpitations: Secondary | ICD-10-CM | POA: Diagnosis not present

## 2018-04-27 ENCOUNTER — Other Ambulatory Visit: Payer: Self-pay

## 2018-04-27 DIAGNOSIS — R002 Palpitations: Secondary | ICD-10-CM

## 2018-05-09 DIAGNOSIS — Z139 Encounter for screening, unspecified: Secondary | ICD-10-CM | POA: Diagnosis not present

## 2018-05-09 DIAGNOSIS — Z79899 Other long term (current) drug therapy: Secondary | ICD-10-CM | POA: Diagnosis not present

## 2018-05-09 DIAGNOSIS — E559 Vitamin D deficiency, unspecified: Secondary | ICD-10-CM | POA: Diagnosis not present

## 2018-05-09 DIAGNOSIS — Z0130 Encounter for examination of blood pressure without abnormal findings: Secondary | ICD-10-CM | POA: Diagnosis not present

## 2018-05-09 DIAGNOSIS — E119 Type 2 diabetes mellitus without complications: Secondary | ICD-10-CM | POA: Diagnosis not present

## 2018-05-16 DIAGNOSIS — E559 Vitamin D deficiency, unspecified: Secondary | ICD-10-CM | POA: Diagnosis not present

## 2018-05-16 DIAGNOSIS — Z008 Encounter for other general examination: Secondary | ICD-10-CM | POA: Diagnosis not present

## 2018-05-16 DIAGNOSIS — Z719 Counseling, unspecified: Secondary | ICD-10-CM | POA: Diagnosis not present

## 2018-05-16 DIAGNOSIS — E119 Type 2 diabetes mellitus without complications: Secondary | ICD-10-CM | POA: Diagnosis not present

## 2018-05-26 ENCOUNTER — Telehealth: Payer: Self-pay

## 2018-05-26 NOTE — Telephone Encounter (Signed)
-----   Message from Arnoldo Lenis, MD sent at 05/25/2018  4:31 PM EST ----- Heart monitor overall looks fine, will discuss in detail at her f/u next week  Zandra Abts MD

## 2018-05-26 NOTE — Telephone Encounter (Signed)
Called pt. No answer, left message for pt to return call.  

## 2018-05-30 DIAGNOSIS — R799 Abnormal finding of blood chemistry, unspecified: Secondary | ICD-10-CM | POA: Diagnosis not present

## 2018-05-30 DIAGNOSIS — Z713 Dietary counseling and surveillance: Secondary | ICD-10-CM | POA: Diagnosis not present

## 2018-05-30 DIAGNOSIS — E669 Obesity, unspecified: Secondary | ICD-10-CM | POA: Diagnosis not present

## 2018-05-30 DIAGNOSIS — Z0130 Encounter for examination of blood pressure without abnormal findings: Secondary | ICD-10-CM | POA: Diagnosis not present

## 2018-05-31 ENCOUNTER — Ambulatory Visit: Payer: BLUE CROSS/BLUE SHIELD | Admitting: Student

## 2018-05-31 ENCOUNTER — Encounter: Payer: Self-pay | Admitting: Student

## 2018-05-31 VITALS — BP 124/72 | HR 63 | Ht 66.0 in | Wt 230.0 lb

## 2018-05-31 DIAGNOSIS — R002 Palpitations: Secondary | ICD-10-CM

## 2018-05-31 DIAGNOSIS — I10 Essential (primary) hypertension: Secondary | ICD-10-CM

## 2018-05-31 DIAGNOSIS — R0789 Other chest pain: Secondary | ICD-10-CM

## 2018-05-31 NOTE — Patient Instructions (Signed)
Medication Instructions:  Your physician recommends that you continue on your current medications as directed. Please refer to the Current Medication list given to you today.  If you need a refill on your cardiac medications before your next appointment, please call your pharmacy.   Lab work: NONE  If you have labs (blood work) drawn today and your tests are completely normal, you will receive your results only by: . MyChart Message (if you have MyChart) OR . A paper copy in the mail If you have any lab test that is abnormal or we need to change your treatment, we will call you to review the results.  Testing/Procedures: NONE   Follow-Up: At [facility], you and your health needs are our priority.  As part of our continuing mission to provide you with exceptional heart care, we have created designated Provider Care Teams.  These Care Teams include your primary Cardiologist (physician) and Advanced Practice Providers (APPs -  Physician Assistants and Nurse Practitioners) who all work together to provide you with the care you need, when you need it. You will need a follow up appointment in 1 years.  Please call our office 2 months in advance to schedule this appointment.  You may see Branch, Jonathan, MD or one of the following Advanced Practice Providers on your designated Care Team:   Brittany Strader, PA-C ([place] [facility]) . Michele Lenze, PA-C ([place] [facility])  Any Other Special Instructions Will Be Listed Below (If Applicable). Thank you for choosing [facility]!     

## 2018-05-31 NOTE — Progress Notes (Signed)
Cardiology Office Note    Date:  05/31/2018   ID:  [name], DOB Dec 12, 1960, MRN [id number]  PCP:  Kathyrn Drown, MD  Cardiologist: Carlyle Dolly, MD    Chief Complaint  Patient presents with  . Hospitalization Follow-up    History of Present Illness:    [name] is a 57 y.o. female with past medical history of HTN and Type II DM who presents to the office today for hospital follow-up.  She was recently admitted to [facility] on 04/17/2018 for evaluation of intermittent palpitations and chest pain over the past 3 weeks. She reported episodes of her heart racing which could last for a few minutes up to several hours at a time and experience associated chest discomfort when this would occur. She denied any associated lightheadedness, dizziness, or presyncope. Cyclic troponin values remained negative and her echocardiogram showed a preserved EF of 60 to 65% with no regional wall motion abnormalities. Her repeat EKG following admission did show mild anterior TWI, therefore a Lexiscan Myoview was arranged. This showed defects consistent with gut radiotracer uptake but no significant ischemia and was overall a low risk study. An outpatient event monitor was therefore obtained given her palpitations and showed normal sinus rhythm with minimal heart rate in the 40's while sleeping and an average heart rate of 69 with no significant arrhythmias noted.  In talking with the patient today, she reports overall doing well from a cardiac perspective since her recent hospitalization. She reports that she was having a burning sensation along her sternal region which was occurring after consuming spicy foods. She was started on PPI therapy by her PCP and started to reduce her consumption of spicy and acidic foods and denies any recurrent symptoms since. No recent dyspnea on exertion, orthopnea, PND, lower extremity edema, dizziness, or presyncope.  She does check her blood pressure occasionally  at work and when at the grocery store and says this has overall been well controlled. BP is initially elevated at 150/86 during today's visit, improved to 124/72 on recheck. She reports good compliance with her current medication regimen.   Past Medical History:  Diagnosis Date  . Diabetes mellitus without complication ([facility])   . Family history of adverse reaction to anesthesia    mom has PONV  . Hypertension   . Impaired fasting glucose 09/2011    Past Surgical History:  Procedure Laterality Date  . COLONOSCOPY WITH PROPOFOL N/A 11/25/2016   Procedure: COLONOSCOPY WITH PROPOFOL;  Surgeon: Daneil Dolin, MD;  [facility]: [facility];  Service: Endoscopy;  Laterality: N/A;  10:15am  . ENDOMETRIAL ABLATION    . POLYPECTOMY  11/25/2016   Procedure: POLYPECTOMY;  Surgeon: Daneil Dolin, MD;  Location: [facility];  Service: Endoscopy;;  colon  . TONSILLECTOMY    . TUBAL LIGATION  1987    Current Medications: Outpatient Medications Prior to Visit  Medication Sig Dispense Refill  . amLODipine-benazepril (LOTREL) 10-40 MG capsule Take 1 capsule by mouth daily.    . indapamide (LOZOL) 2.5 MG tablet Take 1 tablet by mouth daily.    . metFORMIN (GLUCOPHAGE) 500 MG tablet Take by mouth daily.    . ONE TOUCH ULTRA TEST test strip USE ONE STRIP TO CHECK GLUCOSE UP TO ONCE DAILY 50 each 5  . pantoprazole (PROTONIX) 40 MG tablet Take 1 tablet (40 mg total) by mouth daily. 30 tablet 2  . potassium chloride (K-DUR) 10 MEQ tablet Take one tablet by mouth  daily 30 tablet 5   No facility-administered medications prior to visit.      Allergies:   Patient has no known allergies.   Social History   Socioeconomic History  . Marital status: Married    Spouse name: Not on file  . Number of children: 3  . Years of education: Not on file  . Highest education level: Not on file  Occupational History  . Not on file  Social Needs  . Financial resource strain: Not on file  . Food insecurity:     Worry: Not on file    Inability: Not on file  . Transportation needs:    Medical: Not on file    Non-medical: Not on file  Tobacco Use  . Smoking status: Never Smoker  . Smokeless tobacco: Never Used  Substance and Sexual Activity  . Alcohol use: No  . Drug use: No  . Sexual activity: Yes    Birth control/protection: Post-menopausal, Surgical  Lifestyle  . Physical activity:    Days per week: Not on file    Minutes per session: Not on file  . Stress: Not on file  Relationships  . Social connections:    Talks on phone: Not on file    Gets together: Not on file    Attends religious service: Not on file    Active member of club or organization: Not on file    Attends meetings of clubs or organizations: Not on file    Relationship status: Not on file  Other Topics Concern  . Not on file  Social History Narrative  . Not on file     Family History:  The patient's family history includes CAD in her father; Cancer (age of onset: 29) in her father; Heart disease in her maternal grandmother; Hypertension in her father; Liver disease in her mother.   Review of Systems:   Please see the history of present illness.     General:  No chills, fever, night sweats or weight changes.  Cardiovascular:  No chest pain, dyspnea on exertion, edema, orthopnea, paroxysmal nocturnal dyspnea. Positive for palpitations (now resolved).  Dermatological: No rash, lesions/masses Respiratory: No cough, dyspnea Urologic: No hematuria, dysuria Abdominal:   No nausea, vomiting, diarrhea, bright red blood per rectum, melena, or hematemesis Neurologic:  No visual changes, wkns, changes in mental status. All other systems reviewed and are otherwise negative except as noted above.   Physical Exam:    VS:  BP 124/72   Pulse 63   Ht 5\' 6"  (1.676 m)   Wt 230 lb (104.3 kg)   SpO2 98%   BMI 37.12 kg/m    General: Well developed, well nourished [place] American female appearing in no acute distress. Head:  Normocephalic, atraumatic, sclera non-icteric, no xanthomas, nares are without discharge.  Neck: No carotid bruits. JVD not elevated.  Lungs: Respirations regular and unlabored, without wheezes or rales.  Heart: Regular rate and rhythm. No S3 or S4.  No murmur, no rubs, or gallops appreciated. Abdomen: Soft, non-tender, non-distended with normoactive bowel sounds. No hepatomegaly. No rebound/guarding. No obvious abdominal masses. Msk:  Strength and tone appear normal for age. No joint deformities or effusions. Extremities: No clubbing or cyanosis. No lower extremity edema.  Distal pedal pulses are 2+ bilaterally. Neuro: Alert and oriented X 3. Moves all extremities spontaneously. No focal deficits noted. Psych:  Responds to questions appropriately with a normal affect. Skin: No rashes or lesions noted  Wt Readings from Last 3 Encounters:  05/31/18 230 lb (104.3 kg)  04/21/18 222 lb (100.7 kg)  04/17/18 221 lb 9 oz (100.5 kg)     Studies/Labs Reviewed:   EKG:  EKG is not ordered today.   Recent Labs: 04/17/2018: Hemoglobin 11.9; Magnesium 1.9; Platelets 259 04/18/2018: BUN 22; Creatinine, Ser 0.83; Potassium 3.5; Sodium 137; TSH 1.092   Lipid Panel    Component Value Date/Time   CHOL 182 04/18/2018 0209   TRIG 93 04/18/2018 0209   HDL 51 04/18/2018 0209   CHOLHDL 3.6 04/18/2018 0209   VLDL 19 04/18/2018 0209   LDLCALC 112 (H) 04/18/2018 0209    Additional studies/ records that were reviewed today include:   Echocardiogram: 04/18/2018 Study Conclusions  - Left ventricle: The cavity size was normal. Wall thickness was   increased in a pattern of mild LVH. Systolic function was normal.   The estimated ejection fraction was in the range of 60% to 65%.   Wall motion was normal; there were no regional wall motion   abnormalities. Left ventricular diastolic function parameters   were normal. - Aortic valve: Mildly calcified annulus. Trileaflet; normal   thickness leaflets.  Valve area (VTI): 3.01 cm^2. Valve area   (Vmax): 3.39 cm^2. - Atrial septum: No defect or patent foramen ovale was identified. - Technically adequate study.  NST: 04/18/2018  There was no ST segment deviation noted during stress.  The study is normal. Inferior/inferoapical defects consistent with gut radiotracer uptake artifact.  This is a low risk study.  The left ventricular ejection fraction is hyperdynamic (>65%).  Event Monitor: 04/2018  14 day event monitor  Min HR 44, Max HR 144, Avg HR 69. Min HR occurred in early AM hours presumably while sleeping  No symptoms reported  Telemetry tracings show sinus rhythm, no significant arrhythmias  Assessment:    1. Heart palpitations   2. Atypical chest pain   3. Essential hypertension      Plan:   In order of problems listed above:  1. Palpitations - Was previously experiencing palpitations for the 3 weeks leading up to her recent admission. Labs showed no acute findings. Telemetry during that timeframe showed no significant arrhythmias and an outpatient event monitor showed NSR with minimal heart rate in the 40's while sleeping and an average heart rate of 69. - She denies any recurrent symptoms since hospital discharge. Reports that she did stop consuming coffee. Continued limitation of alcohol and caffeine intake was reviewed. She denies any recurrent symptoms and her event monitor showed no significant arrhythmias. No indication for changes in her medication regimen at this time.   2. Atypical Chest Pain - Reported occasional episodes of chest discomfort during her recent hospitalization. She reports having a burning sensation along her sternal region which would occur after consuming spicy foods following hospital discharge but this has since resolved since being started on PPI therapy and being more mindful of the type of food she is consuming. Recent echocardiogram showed a preserved EF of 60 to 65% with no regional  wall motion abnormalities and Lexiscan Myoview showed no significant ischemia and was overall a low risk study. - Based off of her history, her pain seems most consistent with acid reflux. No indication for further ischemic evaluation at this time given her recent NST results. Continue with risk factor modification.  3. HTN - BP initially elevated at 150/86 during today's visit, improved to 124/72 on recheck. Reports this is typically elevated when checked in the office but well-controlled in the ambulatory  setting.  - Continue Amlodipine-Benzapril 10-40 mg daily.   Medication Adjustments/Labs and Tests Ordered: Current medicines are reviewed at length with the patient today.  Concerns regarding medicines are outlined above.  Medication changes, Labs and Tests ordered today are listed in the Patient Instructions below. Patient Instructions  Medication Instructions:  Your physician recommends that you continue on your current medications as directed. Please refer to the Current Medication list given to you today.  If you need a refill on your cardiac medications before your next appointment, please call your pharmacy.   Lab work: NONE  If you have labs (blood work) drawn today and your tests are completely normal, you will receive your results only by: Marland Kitchen MyChart Message (if you have MyChart) OR . A paper copy in the mail If you have any lab test that is abnormal or we need to change your treatment, we will call you to review the results.  Testing/Procedures: NONE   Follow-Up: At [facility], you and your health needs are our priority.  As part of our continuing mission to provide you with exceptional heart care, we have created designated Provider Care Teams.  These Care Teams include your primary Cardiologist (physician) and Advanced Practice Providers (APPs -  Physician Assistants and Nurse Practitioners) who all work together to provide you with the care you need, when you need  it. You will need a follow up appointment in 1 year.  Please call our office 2 months in advance to schedule this appointment.  You may see Carlyle Dolly, MD or one of the following Advanced Practice Providers on your designated Care Team:   Bernerd Pho, PA-C [facility]) . Ermalinda Barrios, PA-C ([place])  Any Other Special Instructions Will Be Listed Below (If Applicable). Thank you for choosing [facility]!      Signed, Erma Heritage, PA-C  05/31/2018 5:33 PM    [place] [address] [place], [place] [address] Phone: [phone]

## 2018-06-06 DIAGNOSIS — R799 Abnormal finding of blood chemistry, unspecified: Secondary | ICD-10-CM | POA: Diagnosis not present

## 2018-06-27 DIAGNOSIS — E119 Type 2 diabetes mellitus without complications: Secondary | ICD-10-CM | POA: Diagnosis not present

## 2018-06-27 DIAGNOSIS — E559 Vitamin D deficiency, unspecified: Secondary | ICD-10-CM | POA: Diagnosis not present

## 2018-06-27 DIAGNOSIS — E782 Mixed hyperlipidemia: Secondary | ICD-10-CM | POA: Diagnosis not present

## 2018-06-27 DIAGNOSIS — Z008 Encounter for other general examination: Secondary | ICD-10-CM | POA: Diagnosis not present

## 2018-06-27 DIAGNOSIS — I10 Essential (primary) hypertension: Secondary | ICD-10-CM | POA: Diagnosis not present

## 2018-07-13 DIAGNOSIS — Z712 Person consulting for explanation of examination or test findings: Secondary | ICD-10-CM | POA: Diagnosis not present

## 2018-07-13 DIAGNOSIS — R799 Abnormal finding of blood chemistry, unspecified: Secondary | ICD-10-CM | POA: Diagnosis not present

## 2018-11-02 ENCOUNTER — Other Ambulatory Visit (HOSPITAL_COMMUNITY): Payer: Self-pay | Admitting: Family Medicine

## 2018-11-02 DIAGNOSIS — Z1231 Encounter for screening mammogram for malignant neoplasm of breast: Secondary | ICD-10-CM

## 2018-12-05 ENCOUNTER — Encounter: Payer: Self-pay | Admitting: Family Medicine

## 2018-12-05 DIAGNOSIS — E119 Type 2 diabetes mellitus without complications: Secondary | ICD-10-CM | POA: Diagnosis not present

## 2018-12-05 DIAGNOSIS — Z139 Encounter for screening, unspecified: Secondary | ICD-10-CM | POA: Diagnosis not present

## 2018-12-05 DIAGNOSIS — E782 Mixed hyperlipidemia: Secondary | ICD-10-CM | POA: Diagnosis not present

## 2018-12-05 DIAGNOSIS — Z79899 Other long term (current) drug therapy: Secondary | ICD-10-CM | POA: Diagnosis not present

## 2018-12-15 ENCOUNTER — Other Ambulatory Visit: Payer: Self-pay

## 2018-12-15 ENCOUNTER — Ambulatory Visit (HOSPITAL_COMMUNITY)
Admission: RE | Admit: 2018-12-15 | Discharge: 2018-12-15 | Disposition: A | Discharge status: Home / Self Care | Payer: BC Managed Care – PPO | Source: Ambulatory Visit | Attending: Family Medicine | Admitting: Family Medicine

## 2018-12-15 DIAGNOSIS — Z1231 Encounter for screening mammogram for malignant neoplasm of breast: Secondary | ICD-10-CM | POA: Diagnosis not present

## 2018-12-19 DIAGNOSIS — E119 Type 2 diabetes mellitus without complications: Secondary | ICD-10-CM | POA: Diagnosis not present

## 2018-12-19 DIAGNOSIS — I10 Essential (primary) hypertension: Secondary | ICD-10-CM | POA: Diagnosis not present

## 2018-12-19 DIAGNOSIS — E782 Mixed hyperlipidemia: Secondary | ICD-10-CM | POA: Diagnosis not present

## 2019-01-07 ENCOUNTER — Encounter: Payer: Self-pay | Admitting: Family Medicine

## 2019-01-07 ENCOUNTER — Telehealth: Payer: Self-pay | Admitting: Family Medicine

## 2019-01-07 NOTE — Telephone Encounter (Signed)
A letter was dictated to the patient regarding recent lab work Total cholesterol 211 LDL 130 HDL 64 Creatinine 1.09 calcium 10.8 Liver functions good electrolytes good TIBC good hemoglobin 12.8 WBC 4.9  A1c 6.1 B12 645 TSH 0.94 Patient was encouraged in this letter to set up a office visit

## 2019-03-15 ENCOUNTER — Ambulatory Visit (INDEPENDENT_AMBULATORY_CARE_PROVIDER_SITE_OTHER): Payer: BC Managed Care – PPO | Admitting: Family Medicine

## 2019-03-15 ENCOUNTER — Other Ambulatory Visit: Payer: Self-pay

## 2019-03-15 DIAGNOSIS — E119 Type 2 diabetes mellitus without complications: Secondary | ICD-10-CM

## 2019-03-15 DIAGNOSIS — I10 Essential (primary) hypertension: Secondary | ICD-10-CM

## 2019-03-15 DIAGNOSIS — E7849 Other hyperlipidemia: Secondary | ICD-10-CM

## 2019-03-15 MED ORDER — ROSUVASTATIN CALCIUM 5 MG PO TABS: 5.0000 mg | ORAL_TABLET | Freq: Every day | ORAL | 5 refills | Status: DC

## 2019-03-15 NOTE — Progress Notes (Signed)
Subjective:    Patient ID: [name], female    DOB: 06/12/1961, 58 y.o.   MRN: [id number]  HPI Sugars were up in the low 200s for several readings yesterday and the day before otherwise they have been under good control the A1c it looked good also her calcium was elevated on recent lab work her kidney function look good cholesterol moderately elevated.  Patient follows with the nurse practitioner but has not followed with [place] recently  The patient was seen today as part of a comprehensive diabetic check up.the patient does have diabetes.  The patient follows here on a regular basis.  The patient relates medication compliance. No significant side effects to the medications. Denies any low glucose spells. Relates compliance with diet to a reasonable level. Patient does do labwork intermittently and understands the dangers of diabetes.  Patient for blood pressure check up.  The patient does have hypertension.  The patient is on medication.  Patient relates compliance with meds. Todays BP reviewed with the patient. Patient denies issues with medication. Patient relates reasonable diet. Patient tries to minimize salt. Patient aware of BP goals. She feels her blood pressure been under good control and she gets it checked by nurse practitioner  Other than the recent sugars she feels diabetes under good control Patient calls because her sugar was elevated at 207 2 days ago- her sugars have been back to normal since that one reading.  Virtual Visit via Video Note  I connected with [name] on 03/15/19 at 11:30 AM EDT by a video enabled telemedicine application and verified that I am speaking with the correct person using two identifiers.  Location: Patient: home Provider: office   I discussed the limitations of evaluation and management by telemedicine and the availability of in person appointments. The patient expressed understanding and agreed to proceed.  History of Present Illness:     Observations/Objective:   Assessment and Plan:   Follow Up Instructions:    I discussed the assessment and treatment plan with the patient. The patient was provided an opportunity to ask questions and all were answered. The patient agreed with the plan and demonstrated an understanding of the instructions.   The patient was advised to call back or seek an in-person evaluation if the symptoms worsen or if the condition fails to improve as anticipated.  I provided 17 minutes of non-face-to-face time during this encounter.      Review of Systems  Constitutional: Negative for activity change, appetite change and fatigue.  HENT: Negative for congestion and rhinorrhea.   Respiratory: Negative for cough and shortness of breath.   Cardiovascular: Negative for chest pain and leg swelling.  Gastrointestinal: Negative for abdominal pain and diarrhea.  Endocrine: Negative for polydipsia and polyphagia.  Skin: Negative for color change.  Neurological: Negative for dizziness and weakness.  Psychiatric/Behavioral: Negative for behavioral problems and confusion.       Objective:   Physical Exam  Today's visit was via telephone Physical exam was not possible for this visit       Assessment & Plan:  1. Essential hypertension Blood pressure fair control continue current medications  2. Other hyperlipidemia Statin recommended start Crestor 5 mg daily check lab work later this year  3. Type 2 diabetes mellitus without complication, without long-term current use of insulin (HCC) Recommend follow-up visit in the office check A1c later this year do not adjust medicine currently send [place] readings in several weeks watch diet  4.  Hypercalcemia Check lab work if still elevated may need further work-up watch diet

## 2019-07-16 ENCOUNTER — Telehealth: Payer: Self-pay | Admitting: Family Medicine

## 2019-07-16 NOTE — Telephone Encounter (Signed)
Nurses I received patient's recent lab work. Letter know I received the lab work she can keep everything as is but I do recommend a follow-up visit in the spring this can be in person or virtual depending on her preference  (Total cholesterol 130, HDL 47, LDL 67, glucose 112, creatinine 0.96, electrolytes good, calcium slightly elevated 10.8, liver enzymes normal, hemoglobin 12.0, CBC looks good, A1c 6.0, B12 669, vitamin D 58)

## 2019-07-17 NOTE — Telephone Encounter (Signed)
Left message to return call 

## 2019-07-19 NOTE — Telephone Encounter (Signed)
Discussed with pt and she verbalized understanding. She wanted to go ahead and make appt because she was concerned about her calcium being elevated. She states it has been up for awhile. Transferred to the front to schedule visit to go over results.

## 2019-07-25 ENCOUNTER — Other Ambulatory Visit: Payer: Self-pay

## 2019-07-25 ENCOUNTER — Ambulatory Visit (INDEPENDENT_AMBULATORY_CARE_PROVIDER_SITE_OTHER): Payer: BC Managed Care – PPO | Admitting: Family Medicine

## 2019-07-25 DIAGNOSIS — I10 Essential (primary) hypertension: Secondary | ICD-10-CM

## 2019-07-25 DIAGNOSIS — E7849 Other hyperlipidemia: Secondary | ICD-10-CM

## 2019-07-25 NOTE — Progress Notes (Signed)
   Subjective:    Patient ID: [name], female    DOB: 1960/10/29, 59 y.o.   MRN: [id number]  HPI Pt had labs done recently and is wanting to discuss them with provider. The labs were reviewed with the patient.  Liver enzymes look good kidney function look good.  Not anemic.  Thyroid function good.  A1c reasonable.  Her calcium is elevated and this was elevated on previous lab so therefore it is a persistent elevation this needs further looking into she does state that she is taking 2000 units of vitamin D daily this could be playing a role into this.  She denies any bone pain fever chills or sweats PMH benign Virtual Visit via Telephone Note  I connected with [name] on 07/25/19 at  1:10 PM EST by telephone and verified that I am speaking with the correct person using two identifiers.  Location: Patient: home Provider: office   I discussed the limitations, risks, security and privacy concerns of performing an evaluation and management service by telephone and the availability of in person appointments. I also discussed with the patient that there may be a patient responsible charge related to this service. The patient expressed understanding and agreed to proceed.   History of Present Illness:    Observations/Objective:   Assessment and Plan:   Follow Up Instructions:    I discussed the assessment and treatment plan with the patient. The patient was provided an opportunity to ask questions and all were answered. The patient agreed with the plan and demonstrated an understanding of the instructions.   The patient was advised to call back or seek an in-person evaluation if the symptoms worsen or if the condition fails to improve as anticipated.  I provided 17 minutes of non-face-to-face time during this encounter.       Review of Systems  Constitutional: Negative for activity change, appetite change and fatigue.  HENT: Negative for congestion and rhinorrhea.     Respiratory: Negative for cough and shortness of breath.   Cardiovascular: Negative for chest pain and leg swelling.  Gastrointestinal: Negative for abdominal pain and diarrhea.  Endocrine: Negative for polydipsia and polyphagia.  Skin: Negative for color change.  Neurological: Negative for dizziness and weakness.  Psychiatric/Behavioral: Negative for behavioral problems and confusion.       Objective:   Physical Exam  Today's visit was via telephone Physical exam was not possible for this visit       Assessment & Plan:  Hypercalcemia she needs further work-up with additional lab work these labs were ordered and she will get these drawn over the next couple days and proceed forward with further evaluation based upon these results  HTN decent control continue current measures  Hyperlipidemia please watch diet closely

## 2019-08-07 LAB — PTH, INTACT AND CALCIUM
Calcium: 9.7 mg/dL (ref 8.7–10.2)
PTH: 55 pg/mL (ref 15–65)

## 2019-08-07 LAB — VITAMIN D 1,25 DIHYDROXY
Vitamin D 1, 25 (OH)2 Total: 40 pg/mL
Vitamin D2 1, 25 (OH)2: <10 pg/mL
Vitamin D3 1, 25 (OH)2: 40 pg/mL

## 2019-08-07 LAB — VITAMIN D 25 HYDROXY (VIT D DEFICIENCY, FRACTURES): Vit D, 25-Hydroxy: 48.4 ng/mL (ref 30.0–100.0)

## 2019-08-07 LAB — PTH-RELATED PEPTIDE: PTH-related peptide: <2 pmol/L

## 2019-10-25 IMAGING — MG DIGITAL SCREENING BILATERAL MAMMOGRAM WITH TOMO AND CAD
6 of 10 series · 6 of 30 positions shown · non-contrast
Comparison: Previous exam(s).

CLINICAL DATA: Screening.

EXAM:
DIGITAL SCREENING BILATERAL MAMMOGRAM WITH TOMO AND CAD

[R CC synth-2D (1 of 2)]
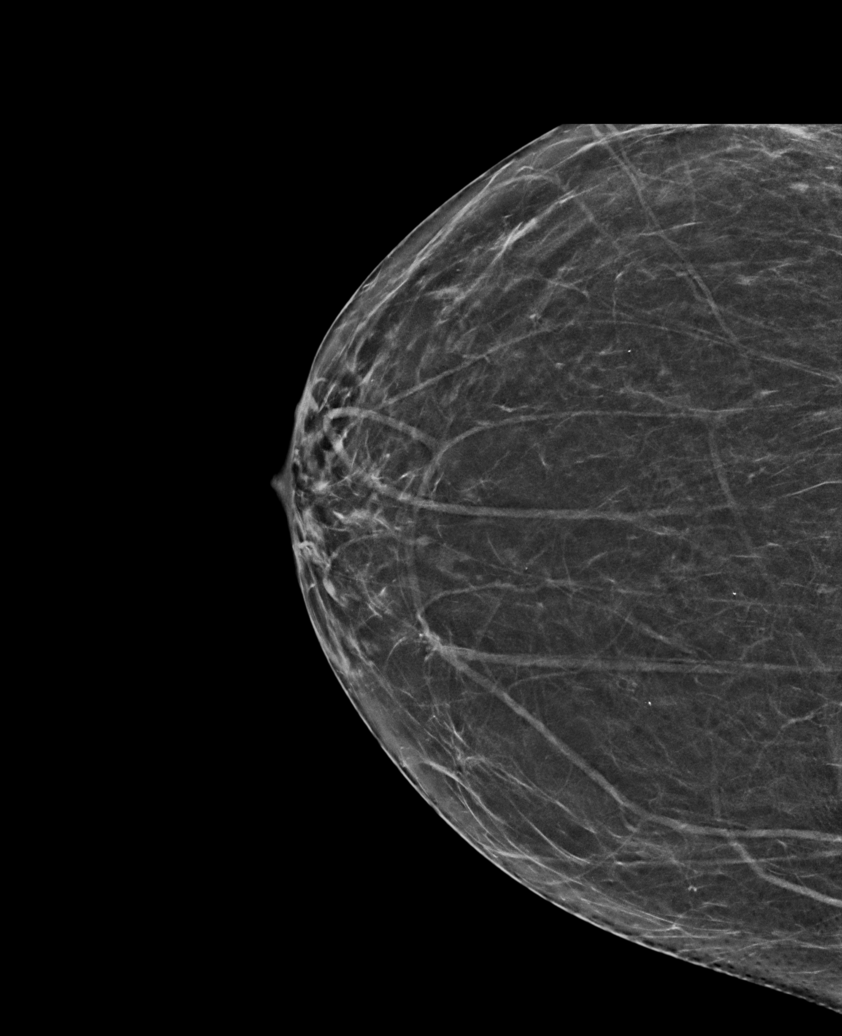

[R MLO synth-2D]
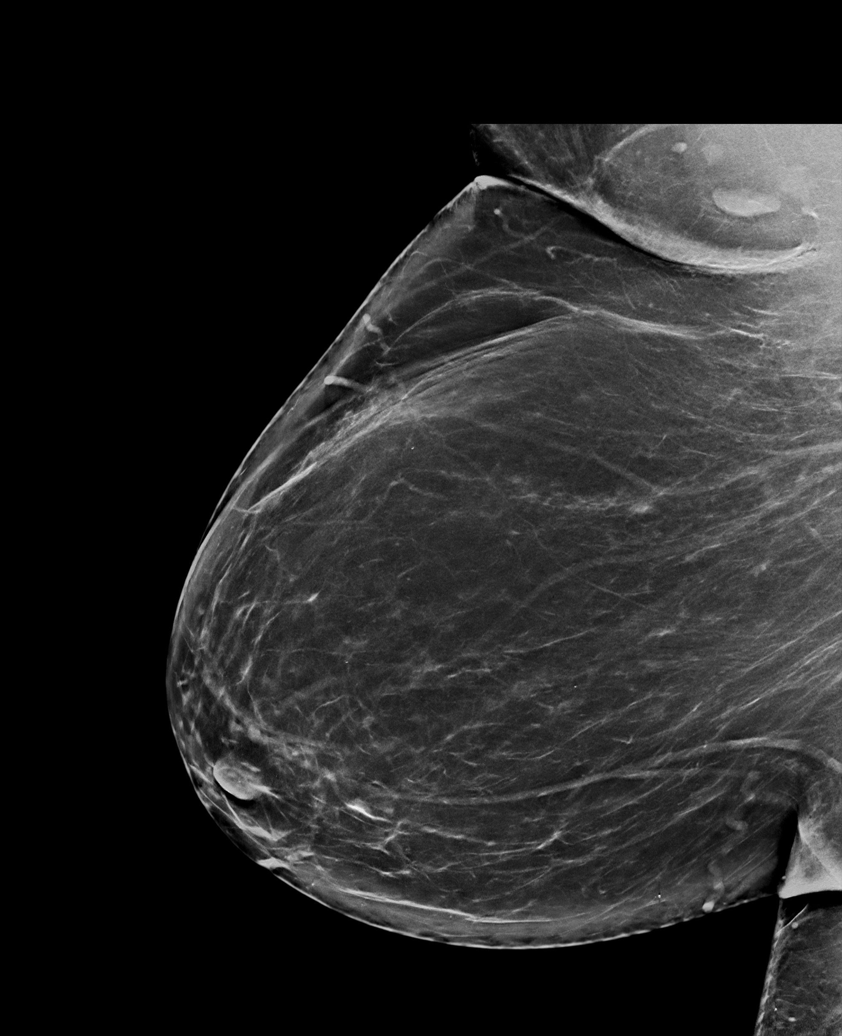

[R CC synth-2D (2 of 2)]
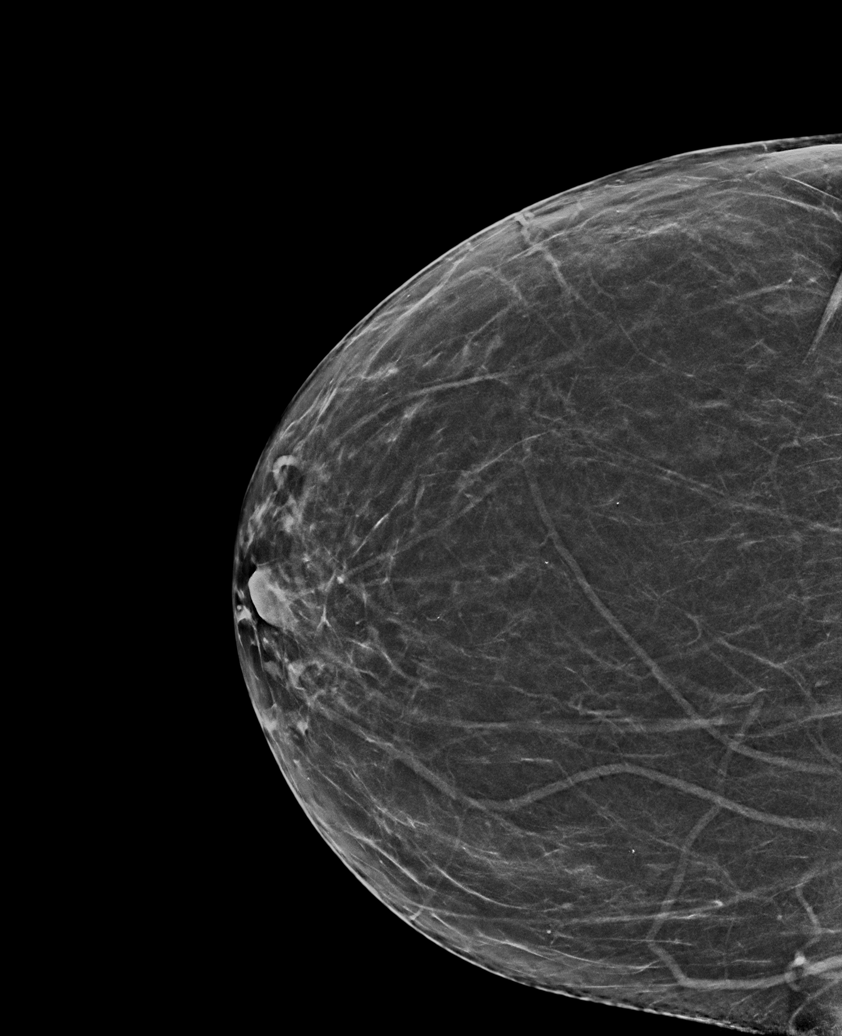

[L CC synth-2D]
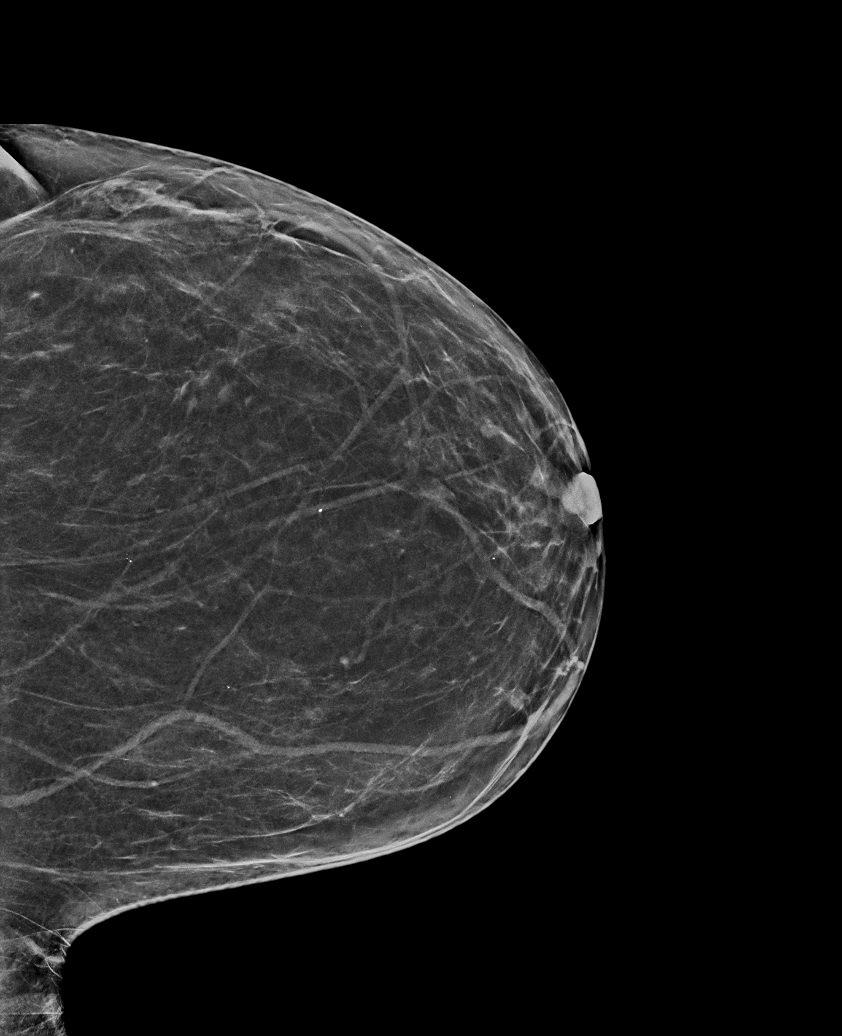

[L MLO synth-2D]
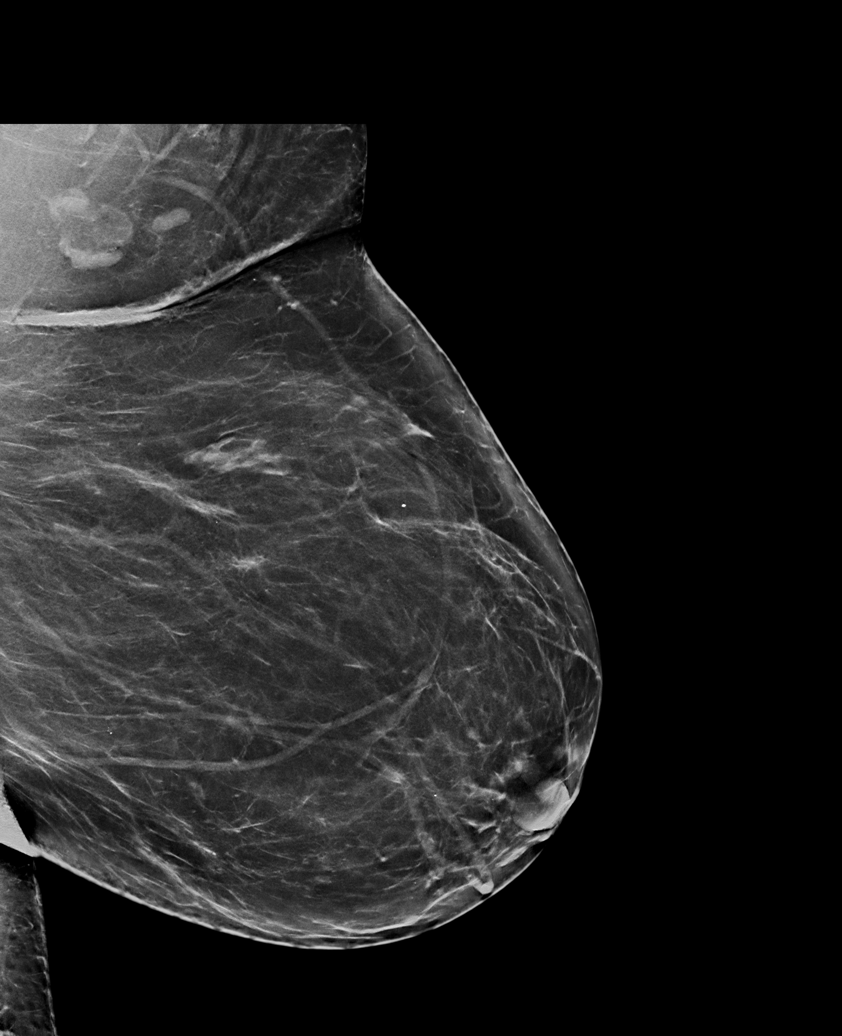

[R MLO tomo · tomo slice 46/91.0]
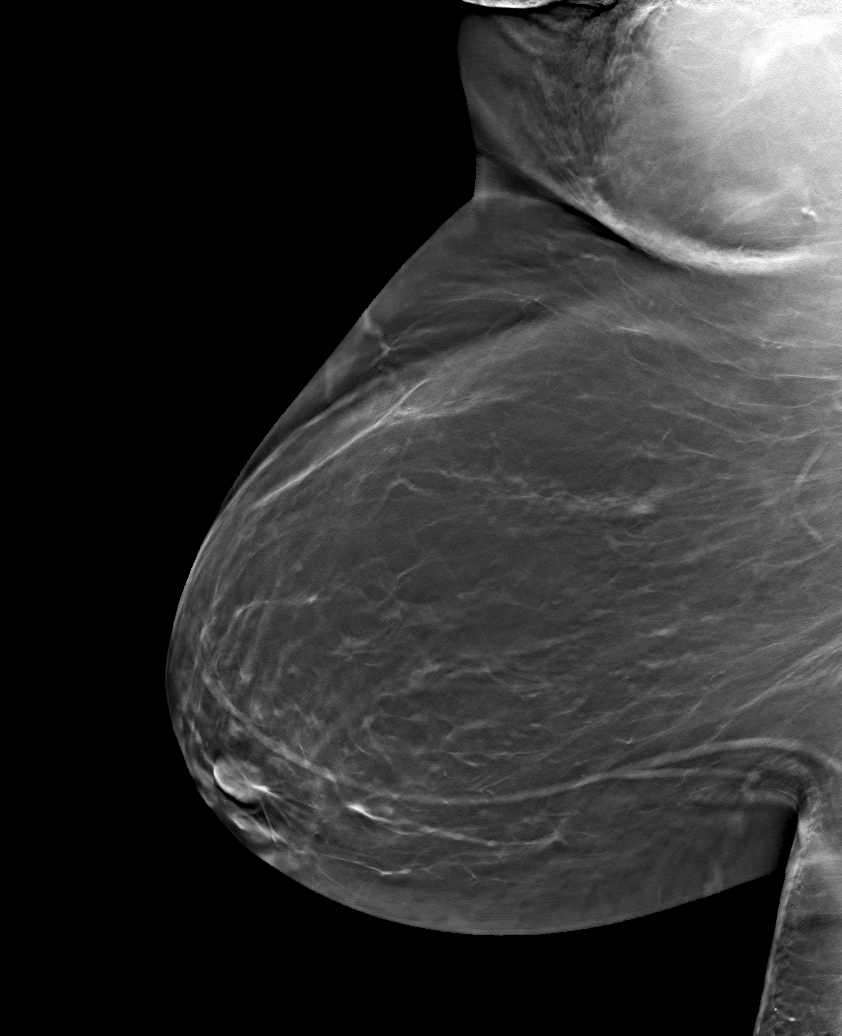

[6 of 30 positions shown; findings below may reference images not displayed]

ACR Breast Density Category b: There are scattered areas of
fibroglandular density.
FINDINGS: There are no findings suspicious for malignancy. Images were
processed with CAD.
IMPRESSION: No mammographic evidence of malignancy. A result letter of this
screening mammogram will be mailed directly to the patient.

RECOMMENDATION:
Screening mammogram in one year. (Code:[id number])

BI-RADS CATEGORY  1: Negative.

## 2019-11-26 ENCOUNTER — Other Ambulatory Visit (HOSPITAL_COMMUNITY): Payer: Self-pay | Admitting: Family Medicine

## 2019-11-26 DIAGNOSIS — Z1231 Encounter for screening mammogram for malignant neoplasm of breast: Secondary | ICD-10-CM

## 2019-12-21 ENCOUNTER — Other Ambulatory Visit: Payer: Self-pay

## 2019-12-21 ENCOUNTER — Ambulatory Visit (HOSPITAL_COMMUNITY)
Admission: RE | Admit: 2019-12-21 | Discharge: 2019-12-21 | Disposition: A | Discharge status: Home / Self Care | Payer: BC Managed Care – PPO | Source: Ambulatory Visit | Attending: Family Medicine | Admitting: Family Medicine

## 2019-12-21 DIAGNOSIS — Z1231 Encounter for screening mammogram for malignant neoplasm of breast: Secondary | ICD-10-CM | POA: Diagnosis not present

## 2020-03-20 ENCOUNTER — Other Ambulatory Visit: Payer: BC Managed Care – PPO

## 2020-03-20 DIAGNOSIS — Z20822 Contact with and (suspected) exposure to covid-19: Secondary | ICD-10-CM

## 2020-03-22 LAB — SARS-COV-2, NAA 2 DAY TAT

## 2020-03-22 LAB — NOVEL CORONAVIRUS, NAA: SARS-CoV-2, NAA: DETECTED — AB

## 2020-03-23 ENCOUNTER — Telehealth: Payer: Self-pay | Admitting: Physician Assistant

## 2020-03-23 NOTE — Telephone Encounter (Signed)
Called to Discuss with patient about Covid symptoms and the use of the monoclonal antibody infusion for those with mild to moderate Covid symptoms and at a high risk of hospitalization.     Pt appears to qualify for this infusion due to co-morbid conditions and/or a member of an at-risk group in accordance with the FDA Emergency Use Authorization.    Unable to reach pt, left a voicemail and sent [employer].  HTN, DM, BMI, SVI   Ledora Bottcher, [place] 03/23/2020, 8:06 AM [place] [address] [place], [place] [address]

## 2020-05-29 ENCOUNTER — Ambulatory Visit: Payer: BC Managed Care – PPO | Attending: Internal Medicine

## 2020-05-29 DIAGNOSIS — Z23 Encounter for immunization: Secondary | ICD-10-CM

## 2020-05-29 NOTE — Progress Notes (Signed)
   Covid-19 Vaccination Clinic  Name:  [name]    MRN: [id number] DOB: 11-20-1960  05/29/2020  [name] was observed post Covid-19 immunization for 15 minutes without incident. She was provided with Vaccine Information Sheet and instruction to access the V-Safe system.   [name] was instructed to call 911 with any severe reactions post vaccine: Marland Kitchen Difficulty breathing  . Swelling of face and throat  . A fast heartbeat  . A bad rash all over body  . Dizziness and weakness   Immunizations Administered    No immunizations on file.

## 2020-05-30 ENCOUNTER — Other Ambulatory Visit: Payer: Self-pay

## 2020-05-30 ENCOUNTER — Ambulatory Visit: Payer: BC Managed Care – PPO | Admitting: Family Medicine

## 2020-05-30 ENCOUNTER — Encounter: Payer: Self-pay | Admitting: Family Medicine

## 2020-05-30 VITALS — BP 130/78 | HR 86 | Temp 96.9°F | Wt 233.4 lb

## 2020-05-30 DIAGNOSIS — T466X5A Adverse effect of antihyperlipidemic and antiarteriosclerotic drugs, initial encounter: Secondary | ICD-10-CM | POA: Insufficient documentation

## 2020-05-30 DIAGNOSIS — M791 Myalgia, unspecified site: Secondary | ICD-10-CM | POA: Insufficient documentation

## 2020-05-30 NOTE — Patient Instructions (Signed)
Diabetes Mellitus and Nutrition, Adult When you have diabetes (diabetes mellitus), it is very important to have healthy eating habits because your blood sugar (glucose) levels are greatly affected by what you eat and drink. Eating healthy foods in the appropriate amounts, at about the same times every day, can help you:  Control your blood glucose.  Lower your risk of heart disease.  Improve your blood pressure.  Reach or maintain a healthy weight. Every person with diabetes is different, and each person has different needs for a meal plan. Your health care provider may recommend that you work with a diet and nutrition specialist (dietitian) to make a meal plan that is best for you. Your meal plan may vary depending on factors such as:  The calories you need.  The medicines you take.  Your weight.  Your blood glucose, blood pressure, and cholesterol levels.  Your activity level.  Other health conditions you have, such as heart or kidney disease. How do carbohydrates affect me? Carbohydrates, also called carbs, affect your blood glucose level more than any other type of food. Eating carbs naturally raises the amount of glucose in your blood. Carb counting is a method for keeping track of how many carbs you eat. Counting carbs is important to keep your blood glucose at a healthy level, especially if you use insulin or take certain oral diabetes medicines. It is important to know how many carbs you can safely have in each meal. This is different for every person. Your dietitian can help you calculate how many carbs you should have at each meal and for each snack. Foods that contain carbs include:  Bread, cereal, rice, pasta, and crackers.  Potatoes and corn.  Peas, beans, and lentils.  Milk and yogurt.  Fruit and juice.  Desserts, such as cakes, cookies, ice cream, and candy. How does alcohol affect me? Alcohol can cause a sudden decrease in blood glucose (hypoglycemia),  especially if you use insulin or take certain oral diabetes medicines. Hypoglycemia can be a life-threatening condition. Symptoms of hypoglycemia (sleepiness, dizziness, and confusion) are similar to symptoms of having too much alcohol. If your health care provider says that alcohol is safe for you, follow these guidelines:  Limit alcohol intake to no more than 1 drink per day for nonpregnant women and 2 drinks per day for men. One drink equals 12 oz of beer, 5 oz of wine, or 1 oz of hard liquor.  Do not drink on an empty stomach.  Keep yourself hydrated with water, diet soda, or unsweetened iced tea.  Keep in mind that regular soda, juice, and other mixers may contain a lot of sugar and must be counted as carbs. What are tips for following this plan?  Reading food labels  Start by checking the serving size on the "Nutrition Facts" label of packaged foods and drinks. The amount of calories, carbs, fats, and other nutrients listed on the label is based on one serving of the item. Many items contain more than one serving per package.  Check the total grams (g) of carbs in one serving. You can calculate the number of servings of carbs in one serving by dividing the total carbs by 15. For example, if a food has 30 g of total carbs, it would be equal to 2 servings of carbs.  Check the number of grams (g) of saturated and trans fats in one serving. Choose foods that have low or no amount of these fats.  Check the number of   milligrams (mg) of salt (sodium) in one serving. Most people should limit total sodium intake to less than 2,300 mg per day.  Always check the nutrition information of foods labeled as "low-fat" or "nonfat". These foods may be higher in added sugar or refined carbs and should be avoided.  Talk to your dietitian to identify your daily goals for nutrients listed on the label. Shopping  Avoid buying canned, premade, or processed foods. These foods tend to be high in fat, sodium,  and added sugar.  Shop around the outside edge of the grocery store. This includes fresh fruits and vegetables, bulk grains, fresh meats, and fresh dairy. Cooking  Use low-heat cooking methods, such as baking, instead of high-heat cooking methods like deep frying.  Cook using healthy oils, such as olive, canola, or sunflower oil.  Avoid cooking with butter, cream, or high-fat meats. Meal planning  Eat meals and snacks regularly, preferably at the same times every day. Avoid going long periods of time without eating.  Eat foods high in fiber, such as fresh fruits, vegetables, beans, and whole grains. Talk to your dietitian about how many servings of carbs you can eat at each meal.  Eat 4-6 ounces (oz) of lean protein each day, such as lean meat, chicken, fish, eggs, or tofu. One oz of lean protein is equal to: ? 1 oz of meat, chicken, or fish. ? 1 egg. ?  cup of tofu.  Eat some foods each day that contain healthy fats, such as avocado, nuts, seeds, and fish. Lifestyle  Check your blood glucose regularly.  Exercise regularly as told by your health care provider. This may include: ? 150 minutes of moderate-intensity or vigorous-intensity exercise each week. This could be brisk walking, biking, or water aerobics. ? Stretching and doing strength exercises, such as yoga or weightlifting, at least 2 times a week.  Take medicines as told by your health care provider.  Do not use any products that contain nicotine or tobacco, such as cigarettes and e-cigarettes. If you need help quitting, ask your health care provider.  Work with a Social worker or diabetes educator to identify strategies to manage stress and any emotional and social challenges. Questions to ask a health care provider  Do I need to meet with a diabetes educator?  Do I need to meet with a dietitian?  What number can I call if I have questions?  When are the best times to check my blood glucose? Where to find more  information:  American Diabetes Association: diabetes.org  Academy of Nutrition and Dietetics: www.eatright.[employer] of Diabetes and Digestive and Kidney Diseases (NIH): [employer] Summary  A healthy meal plan will help you control your blood glucose and maintain a healthy lifestyle.  Working with a diet and nutrition specialist (dietitian) can help you make a meal plan that is best for you.  Keep in mind that carbohydrates (carbs) and alcohol have immediate effects on your blood glucose levels. It is important to count carbs and to use alcohol carefully. This information is not intended to replace advice given to you by your health care provider. Make sure you discuss any questions you have with your health care provider. Document Revised: 05/20/2017 Document Reviewed: 07/12/2016 Elsevier Patient Education  2020 [place] for Diabetes Mellitus, Adult  Carbohydrate counting is a method of keeping track of how many carbohydrates you eat. Eating carbohydrates naturally increases the amount of sugar (glucose) in the blood. Counting how many carbohydrates you eat  helps keep your blood glucose within normal limits, which helps you manage your diabetes (diabetes mellitus). It is important to know how many carbohydrates you can safely have in each meal. This is different for every person. A diet and nutrition specialist (registered dietitian) can help you make a meal plan and calculate how many carbohydrates you should have at each meal and snack. Carbohydrates are found in the following foods:  Grains, such as breads and cereals.  Dried beans and soy products.  Starchy vegetables, such as potatoes, peas, and corn.  Fruit and fruit juices.  Milk and yogurt.  Sweets and snack foods, such as cake, cookies, candy, chips, and soft drinks. How do I count carbohydrates? There are two ways to count carbohydrates in food. You can use either of the  methods or a combination of both. Reading "Nutrition Facts" on packaged food The "Nutrition Facts" list is included on the labels of almost all packaged foods and beverages in the [place] It includes:  The serving size.  Information about nutrients in each serving, including the grams (g) of carbohydrate per serving. To use the "Nutrition Facts":  Decide how many servings you will have.  Multiply the number of servings by the number of carbohydrates per serving.  The resulting number is the total amount of carbohydrates that you will be having. Learning standard serving sizes of other foods When you eat carbohydrate foods that are not packaged or do not include "Nutrition Facts" on the label, you need to measure the servings in order to count the amount of carbohydrates:  Measure the foods that you will eat with a food scale or measuring cup, if needed.  Decide how many standard-size servings you will eat.  Multiply the number of servings by 15. Most carbohydrate-rich foods have about 15 g of carbohydrates per serving. ? For example, if you eat 8 oz (170 g) of strawberries, you will have eaten 2 servings and 30 g of carbohydrates (2 servings x 15 g = 30 g).  For foods that have more than one food mixed, such as soups and casseroles, you must count the carbohydrates in each food that is included. The following list contains standard serving sizes of common carbohydrate-rich foods. Each of these servings has about 15 g of carbohydrates:   hamburger bun or  English muffin.   oz (15 mL) syrup.   oz (14 g) jelly.  1 slice of bread.  1 six-inch tortilla.  3 oz (85 g) cooked rice or pasta.  4 oz (113 g) cooked dried beans.  4 oz (113 g) starchy vegetable, such as peas, corn, or potatoes.  4 oz (113 g) hot cereal.  4 oz (113 g) mashed potatoes or  of a large baked potato.  4 oz (113 g) canned or frozen fruit.  4 oz (120 mL) fruit juice.  4-6 crackers.  6 chicken  nuggets.  6 oz (170 g) unsweetened dry cereal.  6 oz (170 g) plain fat-free yogurt or yogurt sweetened with artificial sweeteners.  8 oz (240 mL) milk.  8 oz (170 g) fresh fruit or one small piece of fruit.  24 oz (680 g) popped popcorn. Example of carbohydrate counting Sample meal  3 oz (85 g) chicken breast.  6 oz (170 g) brown rice.  4 oz (113 g) corn.  8 oz (240 mL) milk.  8 oz (170 g) strawberries with sugar-free whipped topping. Carbohydrate calculation 1. Identify the foods that contain carbohydrates: ? Rice. ? Corn. ?  Milk. ? Strawberries. 2. Calculate how many servings you have of each food: ? 2 servings rice. ? 1 serving corn. ? 1 serving milk. ? 1 serving strawberries. 3. Multiply each number of servings by 15 g: ? 2 servings rice x 15 g = 30 g. ? 1 serving corn x 15 g = 15 g. ? 1 serving milk x 15 g = 15 g. ? 1 serving strawberries x 15 g = 15 g. 4. Add together all of the amounts to find the total grams of carbohydrates eaten: ? 30 g + 15 g + 15 g + 15 g = 75 g of carbohydrates total. Summary  Carbohydrate counting is a method of keeping track of how many carbohydrates you eat.  Eating carbohydrates naturally increases the amount of sugar (glucose) in the blood.  Counting how many carbohydrates you eat helps keep your blood glucose within normal limits, which helps you manage your diabetes.  A diet and nutrition specialist (registered dietitian) can help you make a meal plan and calculate how many carbohydrates you should have at each meal and snack. This information is not intended to replace advice given to you by your health care provider. Make sure you discuss any questions you have with your health care provider. Document Revised: 12/30/2016 Document Reviewed: 11/19/2015 Elsevier Patient Education  [place]. Statin Intolerance Statin intolerance is the inability to take a certain type of cholesterol-lowering medicine (statin) because  of unwanted side effects, such as muscle pain. Statins may be prescribed to improve cholesterol levels and lower the risk for heart attack, heart disease, and stroke. People with statin intolerance may experience muscle pain and cramps (myalgia) that go away when the statin is stopped. What are the causes? The cause of this condition is not known. What increases the risk? You may be at higher risk for statin intolerance if you:  Take more than one type of cholesterol-lowering medicine at a time.  Need a higher than normal dosage of a statin.  Have a history of high CK (creatine kinase) in the blood. CK is an enzyme that is released when muscle tissue is damaged.  Are a woman.  Have a body size that is smaller than normal.  Are age 45 or older.  Drink a lot of alcohol.  Are of Asian descent.  Have kidney, liver, or muscle disease.  Have a low level of hormones that control how your body uses energy (hypothyroidism).  Take certain medicines, including: ? Certain medicines for mental illness (antipsychotics). ? Some types of antibiotics. ? Certain medicines used for blood pressure or heart disease. ? Medicines that reduce the activity of the body's disease-fighting system (immunosuppressants). ? Medicines to treat hepatitis C and HIV/AIDS (human immunodeficiency virus/acquired immunodeficiency syndrome).  Follow an intense exercise program.  Drink a lot of grapefruit juice.  Have a lack of vitamin D (deficiency). What are the signs or symptoms? Signs and symptoms of statin intolerance include:  General muscle aches (myalgia). This may feel similar to muscle aches that are caused by the flu.  Muscle pain, tenderness, cramps, or weakness (myositis).  Severe muscle pain, weakness, and raised blood CK levels (rhabdomyolysis). Symptoms usually go away when the statin is stopped. Rarely, liver damage can also occur, which can cause:  Loss of appetite.  Pain in the upper  right abdomen.  Yellowing of the skin or the white parts of the eyes (jaundice). How is this diagnosed? This condition is diagnosed based on your symptoms, your  medical history, and a physical exam. You may also have blood tests. How is this treated? Your health care provider may have you stop taking the statin for a short time to see if your symptoms go away. Then your provider may restart your statin, or:  Change you to a different statin.  Lower the dosage of your statin.  Have you take your statin less often.  Change you to another type of cholesterol-lowering medicine.  Stop or change any medicines that might be interfering with your statin.  Limit how much grapefruit juice you drink.  Recommend stopping intense exercise. Follow these instructions at home: Medicines  Take your statin medicine as told by your health care provider. Do not stop taking the statin unless your health care provider tells you to stop.  Take other over-the-counter and prescription medicines only as told by your health care provider.  Check with your health care provider before taking any new medicines. Certain medicines can increase your risk for statin intolerance. Lifestyle  Limit alcohol intake to no more than 1 drink a day for nonpregnant women and 2 drinks a day for men. One drink equals 12 oz of beer, 5 oz of wine, or 1 oz of hard liquor.  Do not use any products that contain nicotine or tobacco, such as cigarettes and e-cigarettes. If you need help quitting, ask your health care provider. General instructions   Have blood tests to check CK levels or liver enzymes as told by your health care provider.  Exercise as directed. Ask your health care provider what exercises are best for you. Do not start a new exercise program before talking about it with your health care provider.  Follow instructions from your health care provider about eating or drinking restrictions. Your health care  provider may recommend: ? Limiting the amount of grapefruit juice you drink, or not drinking it at all. ? Eating a diet that is low in saturated fats and high in fiber.  Maintain a healthy weight with diet and exercise.  Keep all follow-up visits as told by your health care provider. This is important. Contact a health care provider if:  You have any symptoms of statin intolerance. Summary  Statins are important medicines for improving your cholesterol, which may reduce your risk for heart attack and stroke.  Some people are not able to continue taking a particular statin because of muscle problems (myalgia) or other side effects.  Myalgia is the most common symptom of statin intolerance. Often, the muscle pain and cramps from myalgia go away when the statin is stopped.  Although rare, liver damage can occur as a result of statin intolerance. You should have routine blood tests to check your liver enzymes.  In most cases, statin intolerance can be managed and you can continue to take a cholesterol-lowering medicine. This information is not intended to replace advice given to you by your health care provider. Make sure you discuss any questions you have with your health care provider. Document Revised: 05/20/2017 Document Reviewed: 11/18/2016 Elsevier Patient Education  [place].

## 2020-05-30 NOTE — Progress Notes (Signed)
Patient ID: [name], female    DOB: [birth year], 59 y.o.   MRN: [id number]   Chief Complaint  Patient presents with  . Hypertension  . Hyperlipidemia    Patient reports only taking rosuvastatin 3 x per week due to body aches and pains.   . Diabetes   Subjective:  CC: here for A1C follow-up  Presents today for follow-up for A1c.  There was some confusion today on how this visit came to be, she believes it was her insurance company that initiated her to make this appointment.  There is no current lab work on file in our EMR, she gets lab work done through her job.  She will have this information faxed over as soon as she can located again.  She reports that the A1c value was 6.3.  She gets all of her refills through the nurse practitioner at her work, this office follows her for her calcium level.  With discussion, she reports that she has been having right leg muscle pain since starting on her statin therapy.  There was a period of time when she was sick and she did not take her statin during that time and the leg felt better.  She has recently started back taking the statin 3-4 times a week and the leg pain has returned.  She denies any chest pain, shortness of breath, fever or chills.  It is noted that she is due for her last Pap smear done August 26, 2014.  Her last mammogram was December 25, 2019 which was normal.  She did get Covid infection in September 2021, has recovered.    Medical History [name] has a past medical history of Diabetes mellitus without complication ([place]), Family history of adverse reaction to anesthesia, Hypertension, and Impaired fasting glucose (09/2011).   Outpatient Encounter Medications as of 05/30/2020  Medication Sig  . amLODipine-benazepril (LOTREL) 10-40 MG capsule Take 1 capsule by mouth daily.  . indapamide (LOZOL) 2.5 MG tablet Take 1 tablet by mouth daily.  . metFORMIN (GLUCOPHAGE) 500 MG tablet Take by mouth daily.  . ONE TOUCH ULTRA TEST test strip USE  ONE STRIP TO CHECK GLUCOSE UP TO ONCE DAILY  . rosuvastatin (CRESTOR) 5 MG tablet Take 1 tablet (5 mg total) by mouth daily.   No facility-administered encounter medications on file as of 05/30/2020.     Review of Systems  Constitutional: Negative for chills and fever.  HENT: Negative for ear pain.   Respiratory: Negative for shortness of breath.   Cardiovascular: Negative for chest pain and leg swelling.  Gastrointestinal: Negative for abdominal pain.  Genitourinary: Negative for dysuria.     Vitals BP 130/78   Pulse 86   Temp (!) 96.9 F (36.1 C)   Wt 233 lb 6.4 oz (105.9 kg)   SpO2 100%   BMI 37.67 kg/m   Objective:   Physical Exam Vitals reviewed.  Constitutional:      General: She is not in acute distress. Cardiovascular:     Rate and Rhythm: Normal rate and regular rhythm.     Heart sounds: Normal heart sounds.  Pulmonary:     Effort: Pulmonary effort is normal.     Breath sounds: Normal breath sounds.  Skin:    General: Skin is warm and dry.  Neurological:     General: No focal deficit present.     Mental Status: She is alert.  Psychiatric:        Behavior: Behavior normal.  Assessment and Plan   1. Myalgia due to statin   She is encouraged to get that lab work since she was as soon as possible to evaluate possible intolerance to statin therapy.  At this point she is encouraged to cut back even more on the statins to see if the muscle pain can go away.  She denies any changes in the color of her urine.  Blood pressure elevated today upon arrival, recheck was 130/78.  She does monitor this at home and reports that her numbers are fairly normal.  Agrees with plan of care discussed today. Understands warning signs to seek further care:  Understands to follow-up with routine physical for Pap smear she will schedule this on the way out today.  She will bring by her lab results so that we can assess her kidney and liver functions.  We will also need her  A1c results.  She gets her refills through the nurse at work.  She is encouraged to see [place] at least every 6 months.

## 2020-06-08 ENCOUNTER — Telehealth: Payer: Self-pay | Admitting: Family Medicine

## 2020-06-08 DIAGNOSIS — T466X5A Adverse effect of antihyperlipidemic and antiarteriosclerotic drugs, initial encounter: Secondary | ICD-10-CM

## 2020-06-08 NOTE — Telephone Encounter (Signed)
Nurses Please connect with patient I did review over her lab work which she forwarded LDL cholesterol is 112 good cholesterol is 58.  Kidney functions look good.  Calcium is elevated at 11.1.  Liver functions look good.  Hemoglobin slightly low at 11.3.  A1c is 6.3.  Vitamin B12 level normal.  Vitamin D level normal at 52  Patient recently was seen by Santiago Glad for statin intolerance. Patient does have history of diabetes  Because of the calcium as well as the issues regarding statins I would recommend she do additional lab work to delineate out the issues.  I would recommend parathyroid hormone related peptide as well as CK level.  I also recommend a 1, 25-dihydroxy vitamin D level These labs can be done through [facility].  If she wants these done through her workplace print out the orders and get them to her but it would be imperative that we received a copy of the results if they are done through the workplace otherwise we cannot be responsible for these.  Patient has a wellness visit with [place] coming up.  She had discussed with Santiago Glad about figuring out the dose of statins that she could tolerate.  Statins lower the risk of heart disease with diabetics by 40% so therefore it is important to take these.  It is important for her to give [place] feedback regarding if she tolerates it better 2 or 3 days/week versus every single day.   Hopefully all this the patient will understand but if she needs detailed discussion I would recommend a specific office visit with me to discuss

## 2020-06-09 NOTE — Telephone Encounter (Signed)
Patient advised per Dr Nicki Reaper:  Dr Nicki Reaper did review over her lab work which she forwarded LDL cholesterol is 112 good cholesterol is 58.  Kidney functions look good.  Calcium is elevated at 11.1.  Liver functions look good.  Hemoglobin slightly low at 11.3.  A1c is 6.3.  Vitamin B12 level normal.  Vitamin D level normal at 52  Patient recently was seen by Santiago Glad for statin intolerance. Patient does have history of diabetes  Because of the calcium as well as the issues regarding statins I would recommend she do additional lab work to delineate out the issues.  Dr Nicki Reaper would recommend parathyroid hormone related peptide as well as CK level, also recommend a 1, 25-dihydroxy vitamin D level These labs can be done through [facility].    Patient has a wellness visit with [place] coming up.  She had discussed with Santiago Glad about figuring out the dose of statins that she could tolerate.  Statins lower the risk of heart disease with diabetics by 40% so therefore it is important to take these.  It is important for her to give [place] feedback regarding if she tolerates it better 2 or 3 days/week versus every single day.   Patient verbalized understanding and blood work was ordered in [employer].

## 2020-06-09 NOTE — Addendum Note (Signed)
Addended by: Dairl Ponder on: 06/09/2020 11:26 AM   Modules accepted: Orders

## 2020-06-11 DIAGNOSIS — T466X5A Adverse effect of antihyperlipidemic and antiarteriosclerotic drugs, initial encounter: Secondary | ICD-10-CM | POA: Diagnosis not present

## 2020-06-11 DIAGNOSIS — M791 Myalgia, unspecified site: Secondary | ICD-10-CM | POA: Diagnosis not present

## 2020-06-19 ENCOUNTER — Encounter: Payer: Self-pay | Admitting: Nurse Practitioner

## 2020-06-19 ENCOUNTER — Ambulatory Visit (INDEPENDENT_AMBULATORY_CARE_PROVIDER_SITE_OTHER): Payer: BC Managed Care – PPO | Admitting: Nurse Practitioner

## 2020-06-19 ENCOUNTER — Other Ambulatory Visit: Payer: Self-pay

## 2020-06-19 VITALS — BP 128/78 | HR 86 | Temp 97.4°F | Ht 66.0 in | Wt 231.8 lb

## 2020-06-19 DIAGNOSIS — Z1151 Encounter for screening for human papillomavirus (HPV): Secondary | ICD-10-CM

## 2020-06-19 DIAGNOSIS — Z01419 Encounter for gynecological examination (general) (routine) without abnormal findings: Secondary | ICD-10-CM | POA: Diagnosis not present

## 2020-06-19 DIAGNOSIS — Z23 Encounter for immunization: Secondary | ICD-10-CM

## 2020-06-19 DIAGNOSIS — Z124 Encounter for screening for malignant neoplasm of cervix: Secondary | ICD-10-CM

## 2020-06-19 LAB — VITAMIN D 1,25 DIHYDROXY
Vitamin D 1, 25 (OH)2 Total: 31 pg/mL
Vitamin D2 1, 25 (OH)2: <10 pg/mL
Vitamin D3 1, 25 (OH)2: 24 pg/mL

## 2020-06-19 LAB — CK: Total CK: 101 U/L (ref 32–182)

## 2020-06-19 LAB — PTH-RELATED PEPTIDE: PTH-related peptide: <2 pmol/L

## 2020-06-19 NOTE — Progress Notes (Signed)
Subjective:    Patient ID: [name], female    DOB: 11-08-1960, 59 y.o.   MRN: [id number]  HPI The patient comes in today for a wellness visit.    A review of their health history was completed.  A review of medications was also completed.  Eating habits: good   Falls/  MVA accidents in past few months: none  Regular exercise: yes  Specialist pt sees on regular basis: none  Preventative health issues were discussed.   Additional concerns: patient reports feeling a tugging in her right breast on and off for several days, this is something she has felt in the past.  No vaginal bleeding.  No pelvic pain.  No new sexual partners.  Is currently separated from her husband.  Has been off her blood pressure pills for 2 days.  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  Takes her Crestor 5 mg 2 days/week.  Has tried to increase it to 3 days/week but is unable to tolerate side effects.  Regular vision and dental care. Labs are done through her job and followed by Shaune Pollack, NP. A copy is available today and will be scanned into chart.   Review of Systems  Constitutional: Negative for activity change, appetite change and fatigue.  Respiratory: Negative for cough, chest tightness, shortness of breath and wheezing.   Cardiovascular: Negative for chest pain.  Gastrointestinal: Negative for abdominal distention, abdominal pain, blood in stool, constipation, diarrhea, nausea and vomiting.  Genitourinary: Negative for difficulty urinating, dysuria, enuresis, frequency, genital sores, menstrual problem, pelvic pain, urgency, vaginal bleeding and vaginal discharge.   Depression screen [facility] 2/9 06/19/2020 05/30/2020  Decreased Interest 0 0  Down, Depressed, Hopeless 0 0  PHQ - 2 Score 0 0  Altered sleeping 3 -  Tired, decreased energy 1 -  Change in appetite 1 -  Feeling bad or failure about yourself  0 -  Trouble concentrating 0 -  Moving slowly or fidgety/restless 0 -  Suicidal  thoughts 0 -  PHQ-9 Score 5 -  Difficult doing work/chores Not difficult at all -        Objective:   Physical Exam Constitutional:      General: She is not in acute distress.    Appearance: She is well-developed.  HENT:     Mouth/Throat:     Mouth: Oropharynx is clear and moist.  Neck:     Thyroid: No thyromegaly.     Trachea: No tracheal deviation.     Comments: Thyroid nontender to palpation, no mass or goiter noted. Cardiovascular:     Rate and Rhythm: Normal rate and regular rhythm.     Heart sounds: Normal heart sounds. No murmur heard. No gallop.   Pulmonary:     Effort: Pulmonary effort is normal.     Breath sounds: Normal breath sounds.  Chest:  Breasts:     Right: No inverted nipple, mass, skin change, tenderness, axillary adenopathy or supraclavicular adenopathy.     Left: No inverted nipple, mass, skin change, tenderness, axillary adenopathy or supraclavicular adenopathy.    Abdominal:     General: There is no distension.     Palpations: Abdomen is soft.     Tenderness: There is no abdominal tenderness.  Genitourinary:    Vagina: Normal. No vaginal discharge.     Uterus: Normal.      Comments: External GU no rashes or lesions.  Vagina slightly pale but moist, no discharge.  Cervix normal limit in  appearance.  No CMT.  Bimanual exam no tenderness or obvious masses, limited due to abdominal girth. Musculoskeletal:        General: No edema.     Cervical back: Normal range of motion and neck supple.  Lymphadenopathy:     Cervical: No cervical adenopathy.     Upper Body:     Right upper body: No supraclavicular, axillary or pectoral adenopathy.     Left upper body: No supraclavicular, axillary or pectoral adenopathy.  Skin:    General: Skin is warm and dry.     Findings: No rash.  Neurological:     Mental Status: She is alert and oriented to person, place, and time.  Psychiatric:        Mood and Affect: Mood and affect and mood normal.        Behavior:  Behavior normal.        Thought Content: Thought content normal.        Judgment: Judgment normal.    Diabetic Foot Exam - Simple   Simple Foot Form Diabetic Foot exam was performed with the following findings: Yes 06/19/2020 11:00 AM  Visual Inspection No deformities, no ulcerations, no other skin breakdown bilaterally: Yes Sensation Testing Intact to touch and monofilament testing bilaterally: Yes Pulse Check See comments: Yes Comments DP pulses present bilaterally; toes warm with normal cap refill. Thickened toenails with discoloration on great and first toe right foot.         Assessment & Plan:   Problem List Items Addressed This Visit      Other   Morbid obesity (HCC)    Other Visit Diagnoses    Well woman exam    -  Primary   Relevant Orders   IGP, Aptima HPV   Need for vaccination       Relevant Orders   Flu Vaccine QUAD 6+ mos PF IM (Fluarix Quad PF) (Completed)   Screening for cervical cancer       Relevant Orders   IGP, Aptima HPV   Screening for HPV (human papillomavirus)       Relevant Orders   IGP, Aptima HPV     Discussed lab work with patient.  She plans to continue follow-up with nurse practitioner at her work for repeat labs. Recommend restarting blood pressure medication as soon as possible.  Discussed importance of healthy diet, weight loss and regular activity. Patient is currently undergoing a divorce but defers need for any counseling at this time. Continue Crestor at least 2 days/week as tolerated. Return in about 6 months (around 12/18/2020) for HTN recheck.

## 2020-06-24 LAB — IGP, APTIMA HPV: HPV Aptima: NEGATIVE

## 2020-10-06 ENCOUNTER — Telehealth: Payer: Self-pay | Admitting: Family Medicine

## 2020-10-06 NOTE — Telephone Encounter (Signed)
Nurses Please let the patient know I did receive her recent lab work and reviewed over it.  Cholesterol profile looks good continue cholesterol medicine.  Kidney functions look good.  Calcium was slightly elevated. Avoid foods high in calcium including excessive dairy products.  Also do not take calcium supplements. Is have the patient do the following lab work in approximately 2 to 3 weeks t-reason: Hypercalcemia TSH, free T4, serum protein electrophoresis, serum free light chain assay I would recommend an office visit in June  A1C6.1 calcium 10.6 electrolytes and liver enzymes look good, cholesterol profile looks good

## 2020-10-07 NOTE — Addendum Note (Signed)
Addended by: Vicente Males on: 10/07/2020 08:29 AM   Modules accepted: Orders

## 2020-10-07 NOTE — Telephone Encounter (Signed)
Pt contacted and verbalized understanding. Pt states she does not do any dairy at all. Lab orders placed and pt is aware.

## 2021-01-15 ENCOUNTER — Other Ambulatory Visit: Payer: Self-pay

## 2021-01-15 ENCOUNTER — Ambulatory Visit: Payer: BC Managed Care – PPO | Admitting: Nurse Practitioner

## 2021-01-15 VITALS — BP 144/83 | Temp 97.5°F | Ht 66.0 in | Wt 234.4 lb

## 2021-01-15 DIAGNOSIS — K760 Fatty (change of) liver, not elsewhere classified: Secondary | ICD-10-CM

## 2021-01-15 DIAGNOSIS — R1011 Right upper quadrant pain: Secondary | ICD-10-CM

## 2021-01-15 NOTE — Progress Notes (Signed)
   Subjective:    Patient ID: [name], female    DOB: Jan 04, 1961, 60 y.o.   MRN: [id number]  HPI  Patient arrives with RUQ abdominal pain for a month.  Describes as a dull nagging pain that comes and goes.  It first thought it was related to eating pistachios but when she stopped eating and the pain continued.  Rarely eats fried foods, did have some fried chicken within the past couple of days and noticed the pain afterwards.  Used to occur mainly at nighttime, for the past week has noticed increased incidences of the pain.  No fever.  No diarrhea or constipation.  Stools normal color.  No nausea or vomiting.  Denies any acid reflux symptoms.  No urinary symptoms.  No back pain. Localized to the right upper quadrant of the abdomen. Nothing seems to alleviate the discomfort.  Has not identified any specific triggers. PMH: Includes hypertension, diabetes, fatty liver disease with elevated liver enzymes and elevated ferritin level.  Had a colonoscopy in 2018.     Objective:   Physical Exam NAD.  Alert, oriented.  Lungs clear.  Heart regular rate rhythm.  Abdomen obese soft nondistended with active bowel sounds x4; distinct mid right upper quadrant tenderness noted to deep palpation.  No obvious masses but exam limited due to abdominal girth. Today's Vitals   01/15/21 1335  BP: (!) 144/83  Temp: (!) 97.5 F (36.4 C)  TempSrc: Oral  Weight: 234 lb 6.4 oz (106.3 kg)  Height: '5\' 6"'$  (1.676 m)   Body mass index is 37.83 kg/m.        Assessment & Plan:   Problem List Items Addressed This Visit       Digestive   Fatty liver   Relevant Orders   CBC with Differential/Platelet (Completed)   Comprehensive metabolic panel (Completed)   Lipase (Completed)   US Abdomen Limited RUQ (LIVER/GB)   Other Visit Diagnoses     RUQ abdominal pain    -  Primary   Relevant Orders   CBC with Differential/Platelet (Completed)   Comprehensive metabolic panel (Completed)   Lipase (Completed)    US Abdomen Limited RUQ (LIVER/GB)      Lab work pending. Ultrasound of the right upper quadrant abdomen ordered. Warning signs reviewed. Further follow-up based on lab and ultrasound, patient to seek help immediately if symptoms worsen.

## 2021-01-16 ENCOUNTER — Encounter: Payer: Self-pay | Admitting: Nurse Practitioner

## 2021-01-16 LAB — COMPREHENSIVE METABOLIC PANEL
ALT: 17 IU/L (ref 0–32)
AST: 19 IU/L (ref 0–40)
Albumin/Globulin Ratio: 2 (ref 1.2–2.2)
Albumin: 5.1 g/dL — ABNORMAL HIGH (ref 3.8–4.9)
Alkaline Phosphatase: 88 IU/L (ref 44–121)
BUN/Creatinine Ratio: 23 (ref 9–23)
BUN: 24 mg/dL (ref 6–24)
Bilirubin Total: 0.4 mg/dL (ref 0.0–1.2)
CO2: 25 mmol/L (ref 20–29)
Calcium: 10.7 mg/dL — ABNORMAL HIGH (ref 8.7–10.2)
Chloride: 104 mmol/L (ref 96–106)
Creatinine, Ser: 1.03 mg/dL — ABNORMAL HIGH (ref 0.57–1.00)
Globulin, Total: 2.5 g/dL (ref 1.5–4.5)
Glucose: 103 mg/dL — ABNORMAL HIGH (ref 65–99)
Potassium: 3.9 mmol/L (ref 3.5–5.2)
Sodium: 148 mmol/L — ABNORMAL HIGH (ref 134–144)
Total Protein: 7.6 g/dL (ref 6.0–8.5)
eGFR: 63 mL/min/{1.73_m2} (ref >59)

## 2021-01-16 LAB — CBC WITH DIFFERENTIAL/PLATELET
Basophils Absolute: 0.1 10*3/uL (ref 0.0–0.2)
Basos: 1 %
EOS (ABSOLUTE): 0.2 10*3/uL (ref 0.0–0.4)
Eos: 4 %
Hematocrit: 38.1 % (ref 34.0–46.6)
Hemoglobin: 13.1 g/dL (ref 11.1–15.9)
Immature Grans (Abs): 0 10*3/uL (ref 0.0–0.1)
Immature Granulocytes: 0 %
Lymphocytes Absolute: 1.4 10*3/uL (ref 0.7–3.1)
Lymphs: 30 %
MCH: 29.2 pg (ref 26.6–33.0)
MCHC: 34.4 g/dL (ref 31.5–35.7)
MCV: 85 fL (ref 79–97)
Monocytes Absolute: 0.4 10*3/uL (ref 0.1–0.9)
Monocytes: 9 %
Neutrophils Absolute: 2.5 10*3/uL (ref 1.4–7.0)
Neutrophils: 56 %
Platelets: 299 10*3/uL (ref 150–450)
RBC: 4.49 x10E6/uL (ref 3.77–5.28)
RDW: 14.2 % (ref 11.7–15.4)
WBC: 4.5 10*3/uL (ref 3.4–10.8)

## 2021-01-16 LAB — LIPASE: Lipase: 15 U/L (ref 14–72)

## 2021-01-20 ENCOUNTER — Telehealth: Payer: Self-pay | Admitting: Family Medicine

## 2021-01-23 ENCOUNTER — Ambulatory Visit (HOSPITAL_COMMUNITY)
Admission: RE | Admit: 2021-01-23 | Discharge: 2021-01-23 | Disposition: A | Discharge status: Home / Self Care | Payer: BC Managed Care – PPO | Source: Ambulatory Visit | Attending: Nurse Practitioner | Admitting: Nurse Practitioner

## 2021-01-23 ENCOUNTER — Other Ambulatory Visit: Payer: Self-pay

## 2021-01-23 DIAGNOSIS — R1011 Right upper quadrant pain: Secondary | ICD-10-CM | POA: Insufficient documentation

## 2021-01-23 DIAGNOSIS — K760 Fatty (change of) liver, not elsewhere classified: Secondary | ICD-10-CM | POA: Diagnosis not present

## 2021-02-02 ENCOUNTER — Other Ambulatory Visit (HOSPITAL_COMMUNITY): Payer: Self-pay | Admitting: Family Medicine

## 2021-02-02 DIAGNOSIS — Z1231 Encounter for screening mammogram for malignant neoplasm of breast: Secondary | ICD-10-CM

## 2021-02-13 ENCOUNTER — Ambulatory Visit (HOSPITAL_COMMUNITY)
Admission: RE | Admit: 2021-02-13 | Discharge: 2021-02-13 | Disposition: A | Discharge status: Home / Self Care | Payer: BC Managed Care – PPO | Source: Ambulatory Visit | Attending: Family Medicine | Admitting: Family Medicine

## 2021-02-13 ENCOUNTER — Other Ambulatory Visit: Payer: Self-pay

## 2021-02-13 DIAGNOSIS — Z1231 Encounter for screening mammogram for malignant neoplasm of breast: Secondary | ICD-10-CM | POA: Insufficient documentation

## 2021-02-20 ENCOUNTER — Other Ambulatory Visit: Payer: Self-pay

## 2021-02-20 ENCOUNTER — Encounter: Payer: Self-pay | Admitting: Family Medicine

## 2021-02-20 ENCOUNTER — Ambulatory Visit: Payer: BC Managed Care – PPO | Admitting: Family Medicine

## 2021-02-20 VITALS — BP 138/84 | Temp 97.2°F | Wt 229.0 lb

## 2021-02-20 DIAGNOSIS — H832X3 Labyrinthine dysfunction, bilateral: Secondary | ICD-10-CM

## 2021-02-20 DIAGNOSIS — I10 Essential (primary) hypertension: Secondary | ICD-10-CM | POA: Diagnosis not present

## 2021-02-20 NOTE — Patient Instructions (Signed)
How to Perform the Epley Maneuver The Epley maneuver is an exercise that relieves symptoms of vertigo. Vertigo is the feeling that you or your surroundings are moving when they are not. When you feel vertigo, you may feel like the room is spinning and may have trouble walking. The Epley maneuver is used for a type of vertigo caused by a calcium deposit in a part of the inner ear. The maneuver involves changing headpositions to help the deposit move out of the area. You can do this maneuver at home whenever you have symptoms of vertigo. You canrepeat it in 24 hours if your vertigo has not gone away. Even though the Epley maneuver may relieve your vertigo for a few weeks, it is possible that your symptoms will return. This maneuver relieves vertigo, but itdoes not relieve dizziness. What are the risks? If it is done correctly, the Epley maneuver is considered safe. Sometimes it can lead to dizziness or nausea that goes away after a short time. If you develop other symptoms--such as changes in vision, weakness, or numbness--stopdoing the maneuver and call your health care provider. Supplies needed: A bed or table. A pillow. How to do the Epley maneuver     Sit on the edge of a bed or table with your back straight and your legs extended or hanging over the edge of the bed or table. Turn your head halfway toward the affected ear or side as told by your health care provider. Lie backward quickly with your head turned until you are lying flat on your back. Your head should dangle (head-hanging position). You may want to position a pillow under your shoulders. Hold this position for at least 30 seconds. If you feel dizzy or have symptoms of vertigo, continue to hold the position until the symptoms stop. Turn your head to the opposite direction until your unaffected ear is facing down. Your head should continue to dangle. Hold this position for at least 30 seconds. If you feel dizzy or have symptoms of  vertigo, continue to hold the position until the symptoms stop. Turn your whole body to the same side as your head so that you are positioned on your side. Your head will now be nearly facedown and no longer needs to dangle. Hold for at least 30 seconds. If you feel dizzy or have symptoms of vertigo, continue to hold the position until the symptoms stop. Sit back up. You can repeat the maneuver in 24 hours if your vertigo does not go away. Follow these instructions at home: For 24 hours after doing the Epley maneuver: Keep your head in an upright position. When lying down to sleep or rest, keep your head raised (elevated) with two or more pillows. Avoid excessive neck movements. Activity Do not drive or use machinery if you feel dizzy. After doing the Epley maneuver, return to your normal activities as told by your health care provider. Ask your health care provider what activities are safe for you. General instructions Drink enough fluid to keep your urine pale yellow. Do not drink alcohol. Take over-the-counter and prescription medicines only as told by your health care provider. Keep all follow-up visits. This is important. Preventing vertigo symptoms Ask your health care provider if there is anything you should do at home to prevent vertigo. He or she may recommend that you: Keep your head elevated with two or more pillows while you sleep. Do not sleep on the side of your affected ear. Get up slowly from bed.   Avoid sudden movements during the day. Avoid extreme head positions or movement, such as looking up or bending over. Contact a health care provider if: Your vertigo gets worse. You have other symptoms, including: Nausea. Vomiting. Headache. Get help right away if you: Have vision changes. Have a headache or neck pain that is severe or getting worse. Cannot stop vomiting. Have new numbness or weakness in any part of your body. These symptoms may represent a serious problem  that is an emergency. Do not wait to see if the symptoms will go away. Get medical help right away. Call your local emergency services (911 in the [place] Do not drive yourself to the hospital. Summary Vertigo is the feeling that you or your surroundings are moving when they are not. The Epley maneuver is an exercise that relieves symptoms of vertigo. If the Epley maneuver is done correctly, it is considered safe. This information is not intended to replace advice given to you by your health care provider. Make sure you discuss any questions you have with your healthcare provider. Document Revised: 05/07/2020 Document Reviewed: 05/07/2020 Elsevier Patient Education  2022 [employer]  

## 2021-02-20 NOTE — Progress Notes (Addendum)
   Subjective:    Patient ID: [name], female    DOB: [birth year], 60 y.o.   MRN: [id number]  HPI Pt has been having dizziness in the mornings for about one month. Pt unable to sleep/lay on right side or bad; pt becomes dizzy and room will begin to spin. Pt also having nausea and vomited x1.   Walked into the door in the morning hours Felt like the room was moving Ceiling seemed to be moving The next Sunday fine until after the nap Felt room spinning Then Tuesday morning felt like being on the ocean and room spinning Then ate then vomited Took 2 or more hours to go away No weakness  Review of Systems     Objective:   Physical Exam No unilateral numbness or weakness detected Finger-to-nose normal Romberg negative HEENT benign  EOMI Able to move about without trouble  No nystagmus    Assessment & Plan:  Vertigo Supportive measures discussed Epley maneuver If not improving over the next 2 weeks let us know we will set up with ENT No need for medications No sign of stroke Regular health follow-ups as well She sees her nurse practitioner on a regular basis for management of her other issues

## 2021-02-23 LAB — PTH, INTACT AND CALCIUM
Calcium: 10.4 mg/dL — ABNORMAL HIGH (ref 8.7–10.2)
PTH: 60 pg/mL (ref 15–65)

## 2021-02-23 LAB — VITAMIN D 25 HYDROXY (VIT D DEFICIENCY, FRACTURES): Vit D, 25-Hydroxy: 50.2 ng/mL (ref 30.0–100.0)

## 2021-02-23 LAB — CALCIUM, IONIZED: Calcium, Ion: 5.2 mg/dL (ref 4.5–5.6)

## 2021-03-03 ENCOUNTER — Telehealth: Payer: Self-pay | Admitting: Family Medicine

## 2021-03-03 NOTE — Telephone Encounter (Signed)
Please advise. Thank you

## 2021-03-03 NOTE — Telephone Encounter (Signed)
Patient would like results recent labs

## 2021-03-06 NOTE — Telephone Encounter (Signed)
Please see result note Initial test indicates the possibility of parathyroid but needs further blood test to help differentiate is the elevated calcium due to parathyroid or due to other causes Please do blood work within the next 7 to 10 days

## 2021-03-06 NOTE — Telephone Encounter (Signed)
Results discussed wit patient. Patient advised per Dr Nicki Reaper: Lab work shows a slight elevation of calcium but not where it was.  Also ionized calcium is normal that is good.  PTH in the upper range of normal.  In these situations we want to make sure that there is not a underlying issue causing the calcium to go up so we do recommend a parathyroid hormone related peptide level PTHrp  There is a possibility that we may have to get a endocrinologist involved if we feel that this is the parathyroid.  Please do this additional test then we will go from there  Patient verbalized understanding. Blood work ordered in [employer].

## 2021-03-06 NOTE — Addendum Note (Signed)
Addended by: Dairl Ponder on: 03/06/2021 04:32 PM   Modules accepted: Orders

## 2021-03-24 ENCOUNTER — Telehealth: Payer: Self-pay | Admitting: Family Medicine

## 2021-03-24 NOTE — Telephone Encounter (Signed)
Patient is wanting to know her resuklts of recent labs

## 2021-03-24 NOTE — Telephone Encounter (Signed)
Left message to return call (when did she have the labs drawn -we do not have the results)

## 2021-03-25 LAB — PTH-RELATED PEPTIDE: PTH-related peptide: <2 pmol/L

## 2021-03-26 ENCOUNTER — Other Ambulatory Visit: Payer: Self-pay | Admitting: Family Medicine

## 2021-03-26 DIAGNOSIS — I10 Essential (primary) hypertension: Secondary | ICD-10-CM

## 2021-03-26 NOTE — Telephone Encounter (Signed)
Left message to return call (see result note)

## 2021-03-26 NOTE — Telephone Encounter (Signed)
Please see results-has message with the result

## 2021-03-27 ENCOUNTER — Other Ambulatory Visit: Payer: Self-pay | Admitting: Family Medicine

## 2021-03-27 DIAGNOSIS — I10 Essential (primary) hypertension: Secondary | ICD-10-CM

## 2021-04-02 DIAGNOSIS — Z23 Encounter for immunization: Secondary | ICD-10-CM | POA: Diagnosis not present

## 2021-10-06 LAB — HEMOGLOBIN A1C: Hemoglobin A1C: 6

## 2021-10-12 ENCOUNTER — Encounter: Payer: Self-pay | Admitting: Family Medicine

## 2021-10-26 ENCOUNTER — Encounter: Payer: Self-pay | Admitting: *Deleted

## 2021-11-13 ENCOUNTER — Ambulatory Visit: Payer: BC Managed Care – PPO | Admitting: Family Medicine

## 2021-12-11 ENCOUNTER — Ambulatory Visit: Payer: BC Managed Care – PPO | Admitting: Family Medicine

## 2021-12-11 DIAGNOSIS — E118 Type 2 diabetes mellitus with unspecified complications: Secondary | ICD-10-CM | POA: Diagnosis not present

## 2021-12-11 DIAGNOSIS — I10 Essential (primary) hypertension: Secondary | ICD-10-CM

## 2022-02-26 ENCOUNTER — Other Ambulatory Visit (HOSPITAL_COMMUNITY): Payer: Self-pay | Admitting: Family Medicine

## 2022-02-26 DIAGNOSIS — Z1231 Encounter for screening mammogram for malignant neoplasm of breast: Secondary | ICD-10-CM

## 2022-03-03 ENCOUNTER — Encounter: Payer: Self-pay | Admitting: *Deleted

## 2022-03-03 NOTE — Patient Instructions (Signed)
  Procedure: Colonoscopy  Estimated body mass index is 36.54 kg/m as calculated from the following:   Height as of 12/11/21: '5\' 6"'$  (1.676 m).   Weight as of 12/11/21: 226 lb 6.4 oz (102.7 kg).   Have you had a colonoscopy before?  2018, Dr. Gala Romney  Do you have family history of colon cancer  no  Previous colonoscopy with polyps removed? no  Do you have a history colorectal cancer?   no  Are you diabetic?  Type 2  Do you have a prosthetic or mechanical heart valve? no  Do you have a pacemaker/defibrillator?   no  Have you had endocarditis/atrial fibrillation?  no  Do you use supplemental oxygen/CPAP?  no  Have you had joint replacement within the last 12 months?  no  Do you tend to be constipated or have to use laxatives?  no   Do you have history of alcohol use? If yes, how much and how often.  no  Do you have history or are you using drugs? If yes, what do are you  using?  no  Have you ever had a stroke/heart attack?  no  Do you take weight loss medication? no  female patients,: have you had a hysterectomy? no                              are you post menopausal?  no                              do you still have your menstrual cycle? no    Date of last menstrual period. N/a  Do you take any blood-thinning medications such as: (Plavix, aspirin, Coumadin, Aggrenox, Brilinta, Xarelto, Eliquis, Pradaxa, Savaysa or Effient) no  If yes we need the name, milligram, dosage and who is prescribing doctor:               Current Outpatient Medications  Medication Sig Dispense Refill   amLODipine-benazepril (LOTREL) 10-40 MG capsule Take 1 capsule by mouth daily.     Cholecalciferol (VITAMIN D3) 10 MCG (400 UNIT) CAPS Take by mouth daily.     indapamide (LOZOL) 2.5 MG tablet Take 1 tablet by mouth daily.     metFORMIN (GLUCOPHAGE) 500 MG tablet Take by mouth daily.     rosuvastatin (CRESTOR) 5 MG tablet Take 1 tablet (5 mg total) by mouth daily. 30 tablet 5   ONE TOUCH ULTRA  TEST test strip USE ONE STRIP TO CHECK GLUCOSE UP TO ONCE DAILY (Patient not taking: Reported on 12/11/2021) 50 each 5   No current facility-administered medications for this visit.    No Known Allergies

## 2022-03-03 NOTE — Progress Notes (Signed)
Last colonoscopy June 2018 with 4 mm tubular adenoma removed. Recommended 5 year surveillance.   OK to schedule.   ASA 2  1 day prior: Take 1/2 dose of metformin.  Day of procedure: No AM diabetes medications.

## 2022-03-04 NOTE — Progress Notes (Signed)
LMOVM to call back 

## 2022-03-05 NOTE — Progress Notes (Signed)
Pt wishes to wait until November to schedule colonoscopy. Will call pt back once Dr.Rourk's schedule opens up.

## 2022-03-19 ENCOUNTER — Ambulatory Visit (HOSPITAL_COMMUNITY)
Admission: RE | Admit: 2022-03-19 | Discharge: 2022-03-19 | Disposition: A | Discharge status: Home / Self Care | Payer: BC Managed Care – PPO | Source: Ambulatory Visit | Attending: Family Medicine | Admitting: Family Medicine

## 2022-03-19 DIAGNOSIS — Z1231 Encounter for screening mammogram for malignant neoplasm of breast: Secondary | ICD-10-CM | POA: Insufficient documentation

## 2022-03-24 NOTE — Progress Notes (Signed)
Called pt, LMOVM to call back to schedule 

## 2022-03-26 ENCOUNTER — Encounter: Payer: Self-pay | Admitting: *Deleted

## 2022-03-26 ENCOUNTER — Telehealth: Payer: Self-pay | Admitting: *Deleted

## 2022-03-26 MED ORDER — PEG 3350-KCL-NA BICARB-NACL 420 G PO SOLR: 4000.0000 mL | Freq: Once | ORAL | 0 refills | Status: AC

## 2022-03-26 NOTE — Telephone Encounter (Signed)
Pt has been scheduled for 05/17/22 at 8:45 am with Dr.Rourk, instructions mailed and prep sent to the pharmacy.

## 2022-05-03 ENCOUNTER — Telehealth (INDEPENDENT_AMBULATORY_CARE_PROVIDER_SITE_OTHER): Payer: Self-pay | Admitting: *Deleted

## 2022-05-03 NOTE — Telephone Encounter (Signed)
Patient called in. Her instructions for her procedure are different than what she was scheduled for. She is on for 11/27. Discussed new dates with her regarding prep. She voiced understanding.

## 2022-05-10 ENCOUNTER — Telehealth: Payer: Self-pay | Admitting: *Deleted

## 2022-05-10 NOTE — Telephone Encounter (Signed)
Pt called and cancelled her procedure for 05/17/22 at 8:45 am with Dr.Rourk. She would like to wait until the beginning of the year.   TCS ASA 2 surveillance

## 2022-05-11 ENCOUNTER — Ambulatory Visit: Payer: BC Managed Care – PPO | Admitting: Nurse Practitioner

## 2022-05-17 ENCOUNTER — Ambulatory Visit (HOSPITAL_COMMUNITY)
Admission: RE | Admit: 2022-05-17 | Payer: BC Managed Care – PPO | Source: Home / Self Care | Admitting: Internal Medicine

## 2022-05-17 ENCOUNTER — Encounter (HOSPITAL_COMMUNITY): Admission: RE | Payer: Self-pay | Source: Home / Self Care

## 2022-05-17 SURGERY — COLONOSCOPY WITH PROPOFOL
Anesthesia: Monitor Anesthesia Care

## 2022-06-18 ENCOUNTER — Ambulatory Visit: Payer: BC Managed Care – PPO | Admitting: Family Medicine

## 2022-06-25 ENCOUNTER — Encounter: Payer: Self-pay | Admitting: Family Medicine

## 2022-06-25 ENCOUNTER — Ambulatory Visit: Payer: BC Managed Care – PPO | Admitting: Family Medicine

## 2022-06-25 VITALS — BP 138/80 | HR 64 | Temp 97.9°F | Ht 66.0 in | Wt 232.0 lb

## 2022-06-25 DIAGNOSIS — I10 Essential (primary) hypertension: Secondary | ICD-10-CM | POA: Diagnosis not present

## 2022-06-25 DIAGNOSIS — Z23 Encounter for immunization: Secondary | ICD-10-CM

## 2022-06-25 DIAGNOSIS — E118 Type 2 diabetes mellitus with unspecified complications: Secondary | ICD-10-CM

## 2022-06-25 NOTE — Patient Instructions (Signed)
Discussion of GLP-1 medications Please remember these medications are to assist with weight reduction and diabetes control.  They do not take the place of healthy eating and regular physical activity. There are several GLP-1 medications that can help with weight reduction and diabetes control.  GLP-1 medications with indication for diabetes-Trulicity, Victoza, Bydureon , Mounjaro, Ozempic, and Rybelsus (although these are injected medicines except for Rybelsus which is a pill) GLP-1 medications with indications for weight loss-Wegovy, and Saxenda  Mechanism of action  These medications stimulate glucagon peptide receptors.  By doing so it does the following:  1-slows down stomach emptying-essentially food takes longer to go through the stomach and the intestines-this can lessen appetite  2-reduces glucagon secretion from the pancreas-this helps keep blood glucose levels stable between meals  3-increase his insulin release from the pancreas-with diabetics this helps keep blood glucose levels stable  4-promotes the feeling of being full in the brain-encouraged him receptors in the brain received the signal so the brain and body know that it is time to stop eating Benefits of the medication  1-reduced weight-reduction of weight is more significant at higher dosing.  Not as much weight reduction with Rybelsus   A- typically Wegovy at higher doses can assist with significant weight reduction.  2-improved blood glucose levels-with diabetics typically we see a significant drop with A1c when the medication is adjusted appropriately.  3-decrease risk of heart attacks and strokes-individuals with type 2 diabetes are at increased risk of heart attacks and strokes.  These GLP-1 medications can reduce the risk by 22%.  Risk of GLP-1 medications-these are some of the risks.  It is important to talk with your provider in a shared discussion before starting any medication.  Contraindications to GLP-1  medications-in other words who should not take these.  1-individuals with history of thyroid medullary cancer  2-individuals with family history of multiple endocrine neoplasm syndrome type II (MEN 2)  3-individuals with a history of pancreatitis  4-those with a history of severe hypersensitivity or allergy reactions to GLP-1 medications  5-should be avoided in individuals with a history of suicidal attempts or active suicidal ideation.  Cautions include- 1- risk of thyroid C-cell tumors were seen in rats during clinical testing in humans the relevancy of this information has not been determined 2-risk of pancreatitis including fatal and nonfatal hemorrhagic or necrotizing pancreatitis 3-gallbladder disease slightly increased risk of gallstones 4-hypoglycemia-more common with individuals who are on insulin or sulfonylurea such as glipizide 5-acute kidney injury or worsening of chronic renal failure often this is triggered by severe vomiting and diarrhea as side effects to GLP-1. 6-slightly increased risk of diabetic retinopathy complications in patients with type 2 diabetes 7-heart rate increase-slight increase of base heart rate with GLP-1 8-suicidal behavior and ideation has been reported in clinical trials with other weight loss medicines.  If depression occurs with medication or suicidal ideation stop medication immediately notify provider or seek help.  Common side effects include nausea, vomiting, diarrhea, constipation and increased heart rate.  Less common side effects severe abdominal pain-if this occurs notify provider stop medication get tested for pancreatitis. Full discussion with your provider should occur before starting these medicines.

## 2022-06-25 NOTE — Progress Notes (Signed)
   Subjective:    Patient ID: [name], female    DOB: [birth year], 62 y.o.   MRN: [id number]  HPI Follow up for hypertension and diabetes, no concerns reported except  Has noticed tingling in her left hand only for about 1 month  Intermittent tingling left hand no weakness Primary hypertension - Plan: Lipid Panel, Hepatic Function Panel, Basic Metabolic Panel, Microalbumin/Creatinine Ratio, Urine, Hemoglobin A1c  Controlled type 2 diabetes mellitus with complication, without long-term current use of insulin (HCC) - Plan: Lipid Panel, Hepatic Function Panel, Basic Metabolic Panel, Microalbumin/Creatinine Ratio, Urine, Hemoglobin A1c  Immunization due - Plan: Flu Vaccine QUAD 46moIM (Fluarix, Fluzone & Alfiuria Quad PF)  Morbid obesity ([place]  Patient is dealing with blood pressure issues.  Having a difficult time with getting her weight down she is trying to watch her diet she is trying to stay physically active she denies any major setbacks Denies any chest tightness pressure pain shortness of breath.  Denies being depressed. Review of Systems     Objective:   Physical Exam General-in no acute distress Eyes-no discharge Lungs-respiratory rate normal, CTA CV-no murmurs,RRR Extremities skin warm dry no edema Neuro grossly normal Behavior normal, alert Patient with morbid obesity       Assessment & Plan:  1. Primary hypertension Continue blood pressure medicine.  Blood pressure mildly elevated compared to where it should be.  Patient states readings typically good she did not take her medicine today. - Lipid Panel - Hepatic Function Panel - Basic Metabolic Panel - Microalbumin/Creatinine Ratio, Urine - Hemoglobin A1c  2. Controlled type 2 diabetes mellitus with complication, without long-term current use of insulin ([place] A1c decent control on recent labs recheck labs she gets her labs through the nurse practitioner work She is also suffering with morbid obesity She has  tried numerous ways to try to lose weight without success Given her comorbidities and morbid obese obesity I would recommend going ahead with Mounjaro or Ozempic mechanism of action discussed.  Patient will let [place] if she would like for [place] prescribe this - Lipid Panel - Hepatic Function Panel - Basic Metabolic Panel - Microalbumin/Creatinine Ratio, Urine - Hemoglobin A1c She was encouraged to get her eye exam 3. Immunization due Flu shot today - Flu Vaccine QUAD 626moM (Fluarix, Fluzone & Alfiuria Quad PF)  Morbid obesity Portion control regular physical activity, GLP-1 medicines would help her In my opinion GLP-1 medicines would be beneficial information was given to her regarding this She will let us[place]ow if she would like for us[place] order it As stated above her nurse practitioner at her workplace does her ordering on her medicines but the patient states that we would need to write the order first she will let us[place]oreanow regarding the GLP-1 medicine  She will do her labs in the near future

## 2022-06-28 ENCOUNTER — Encounter: Payer: Self-pay | Admitting: Family Medicine

## 2022-07-02 DIAGNOSIS — I10 Essential (primary) hypertension: Secondary | ICD-10-CM | POA: Diagnosis not present

## 2022-07-02 DIAGNOSIS — E118 Type 2 diabetes mellitus with unspecified complications: Secondary | ICD-10-CM | POA: Diagnosis not present

## 2022-07-03 LAB — BASIC METABOLIC PANEL
BUN/Creatinine Ratio: 20 (ref 12–28)
BUN: 18 mg/dL (ref 8–27)
CO2: 22 mmol/L (ref 20–29)
Calcium: 10.5 mg/dL — ABNORMAL HIGH (ref 8.7–10.3)
Chloride: 100 mmol/L (ref 96–106)
Creatinine, Ser: 0.88 mg/dL (ref 0.57–1.00)
Glucose: 105 mg/dL — ABNORMAL HIGH (ref 70–99)
Potassium: 4.2 mmol/L (ref 3.5–5.2)
Sodium: 139 mmol/L (ref 134–144)
eGFR: 75 mL/min/{1.73_m2} (ref >59)

## 2022-07-03 LAB — LIPID PANEL
Chol/HDL Ratio: 2.7 ratio (ref 0.0–4.4)
Cholesterol, Total: 154 mg/dL (ref 100–199)
HDL: 57 mg/dL (ref >39)
LDL Chol Calc (NIH): 85 mg/dL (ref 0–99)
Triglycerides: 60 mg/dL (ref 0–149)
VLDL Cholesterol Cal: 12 mg/dL (ref 5–40)

## 2022-07-03 LAB — HEPATIC FUNCTION PANEL
ALT: 21 IU/L (ref 0–32)
AST: 18 IU/L (ref 0–40)
Albumin: 4.6 g/dL (ref 3.9–4.9)
Alkaline Phosphatase: 72 IU/L (ref 44–121)
Bilirubin Total: 0.4 mg/dL (ref 0.0–1.2)
Bilirubin, Direct: 0.13 mg/dL (ref 0.00–0.40)
Total Protein: 7.4 g/dL (ref 6.0–8.5)

## 2022-07-03 LAB — HEMOGLOBIN A1C
Est. average glucose Bld gHb Est-mCnc: 131 mg/dL
Hgb A1c MFr Bld: 6.2 % — ABNORMAL HIGH (ref 4.8–5.6)

## 2022-07-03 LAB — MICROALBUMIN / CREATININE URINE RATIO
Creatinine, Urine: 129.5 mg/dL
Microalb/Creat Ratio: 53 mg/g creat — ABNORMAL HIGH (ref 0–29)
Microalbumin, Urine: 68.1 ug/mL

## 2022-07-05 ENCOUNTER — Telehealth: Payer: Self-pay | Admitting: *Deleted

## 2022-07-05 ENCOUNTER — Other Ambulatory Visit: Payer: Self-pay | Admitting: Family Medicine

## 2022-07-05 MED ORDER — TIRZEPATIDE 2.5 MG/0.5ML ~~LOC~~ SOAJ: SUBCUTANEOUS | 2 refills | Status: DC

## 2022-07-05 NOTE — Telephone Encounter (Signed)
Medication sent to pharmacy. Pt contacted and verbalized understanding. July appt moved up to April

## 2022-07-05 NOTE — Telephone Encounter (Signed)
Recommend Mounjaro 2.5 mg, once weekly, subcutaneous, 1 month supply with 2 refills, She will need to give [place] feedback within 3 weeks how she is doing with this if she is doing well we will gradually increase the dose every month The higher doses are more effective with helping to lose weight as well as control diabetes Follow-up office visit in 3 months since we are starting GLP-1 Please assist her at moving her appointment from July into April thank you

## 2022-07-05 NOTE — Telephone Encounter (Signed)
Patient called back and stated she would like to proceed with injectable medication for diabetes that will help with her weight  [facility]  [place]

## 2022-07-08 NOTE — Addendum Note (Signed)
Addended by: Dairl Ponder on: 07/08/2022 04:01 PM   Modules accepted: Orders

## 2022-07-29 ENCOUNTER — Telehealth: Payer: Self-pay | Admitting: *Deleted

## 2022-07-29 MED ORDER — PEG 3350-KCL-NA BICARB-NACL 420 G PO SOLR: 4000.0000 mL | Freq: Once | ORAL | 0 refills | Status: AC

## 2022-07-29 NOTE — Telephone Encounter (Signed)
PT CALLED IN TO SCHEDULE HER TCS. She has been scheduled for 3/25 at 730am. Aware will send instructions. Rx for prep sent to [facility].

## 2022-09-09 ENCOUNTER — Encounter: Payer: Self-pay | Admitting: "Endocrinology

## 2022-09-10 ENCOUNTER — Telehealth: Payer: Self-pay | Admitting: *Deleted

## 2022-09-10 NOTE — Telephone Encounter (Signed)
Pt called and reminded of procedure on 09/13/22. Verbalized undertanding.

## 2022-09-13 ENCOUNTER — Other Ambulatory Visit: Payer: Self-pay

## 2022-09-13 ENCOUNTER — Ambulatory Visit (HOSPITAL_COMMUNITY): Payer: BC Managed Care – PPO | Admitting: Anesthesiology

## 2022-09-13 ENCOUNTER — Ambulatory Visit (HOSPITAL_COMMUNITY)
Admission: RE | Admit: 2022-09-13 | Discharge: 2022-09-13 | Disposition: A | Discharge status: Home / Self Care | Payer: BC Managed Care – PPO | Attending: Internal Medicine | Admitting: Internal Medicine

## 2022-09-13 ENCOUNTER — Encounter (HOSPITAL_COMMUNITY)
Admission: RE | Disposition: A | Discharge status: Home / Self Care | Payer: Self-pay | Source: Home / Self Care | Attending: Internal Medicine

## 2022-09-13 ENCOUNTER — Ambulatory Visit: Payer: BC Managed Care – PPO | Admitting: "Endocrinology

## 2022-09-13 ENCOUNTER — Encounter (HOSPITAL_COMMUNITY): Payer: Self-pay | Admitting: Internal Medicine

## 2022-09-13 DIAGNOSIS — Z1211 Encounter for screening for malignant neoplasm of colon: Secondary | ICD-10-CM | POA: Diagnosis not present

## 2022-09-13 DIAGNOSIS — Z7984 Long term (current) use of oral hypoglycemic drugs: Secondary | ICD-10-CM | POA: Diagnosis not present

## 2022-09-13 DIAGNOSIS — I10 Essential (primary) hypertension: Secondary | ICD-10-CM | POA: Insufficient documentation

## 2022-09-13 DIAGNOSIS — Z86010 Personal history of colonic polyps: Secondary | ICD-10-CM | POA: Diagnosis not present

## 2022-09-13 DIAGNOSIS — Z800 Family history of malignant neoplasm of digestive organs: Secondary | ICD-10-CM | POA: Insufficient documentation

## 2022-09-13 DIAGNOSIS — E119 Type 2 diabetes mellitus without complications: Secondary | ICD-10-CM | POA: Insufficient documentation

## 2022-09-13 DIAGNOSIS — Z8249 Family history of ischemic heart disease and other diseases of the circulatory system: Secondary | ICD-10-CM | POA: Insufficient documentation

## 2022-09-13 HISTORY — PX: COLONOSCOPY WITH PROPOFOL: SHX5780

## 2022-09-13 LAB — GLUCOSE, CAPILLARY: Glucose-Capillary: 108 mg/dL — ABNORMAL HIGH (ref 70–99)

## 2022-09-13 SURGERY — COLONOSCOPY WITH PROPOFOL
Anesthesia: General

## 2022-09-13 MED ORDER — LACTATED RINGERS IV SOLN
INTRAVENOUS | Status: DC
  Administered 2022-09-13: 1000 mL via INTRAVENOUS

## 2022-09-13 MED ORDER — PROPOFOL 10 MG/ML IV BOLUS
INTRAVENOUS | Status: DC | PRN
  Administered 2022-09-13: 30 mg via INTRAVENOUS
  Administered 2022-09-13: 20 mg via INTRAVENOUS
  Administered 2022-09-13: 100 mg via INTRAVENOUS

## 2022-09-13 NOTE — Anesthesia Postprocedure Evaluation (Signed)
Anesthesia Post Note  Patient: ANONA BREE  Procedure(s) Performed: COLONOSCOPY WITH PROPOFOL  Patient location during evaluation: Phase II Anesthesia Type: General Level of consciousness: awake and alert and oriented Pain management: pain level controlled Vital Signs Assessment: post-procedure vital signs reviewed and stable Respiratory status: spontaneous breathing, nonlabored ventilation and respiratory function stable Cardiovascular status: blood pressure returned to baseline and stable Postop Assessment: no apparent nausea or vomiting Anesthetic complications: no  No notable events documented.   Last Vitals:  Vitals:   09/13/22 0702 09/13/22 0757  BP: 125/85 (!) 94/50  Pulse: (!) 59 (!) 58  Resp: 10 12  Temp: 36.9 C 36.6 C  SpO2: 97% 98%    Last Pain:  Vitals:   09/13/22 0757  TempSrc: Oral  PainSc: 0-No pain                 Deriana Vanderhoef C Jerrilyn Messinger

## 2022-09-13 NOTE — Anesthesia Procedure Notes (Signed)
Date/Time: 09/13/2022 7:33 AM  Performed by: Vista Deck, CRNAPre-anesthesia Checklist: Patient identified, Emergency Drugs available, Suction available, Timeout performed and Patient being monitored Patient Re-evaluated:Patient Re-evaluated prior to induction Oxygen Delivery Method: Nasal Cannula

## 2022-09-13 NOTE — H&P (Signed)
@LOGO @   Primary Care Physician:  Kathyrn Drown, MD Primary Gastroenterologist:  Dr. Gala Romney  Pre-Procedure History & Physical: HPI:  [name] is a 62 y.o. female here for surveillance colonoscopy.  History of colonic adenoma removed 2019.  No bowel symptoms at this time.  Past Medical History:  Diagnosis Date   Diabetes mellitus without complication ([place])    Family history of adverse reaction to anesthesia    mom has PONV   Hypertension    Impaired fasting glucose 09/2011    Past Surgical History:  Procedure Laterality Date   COLONOSCOPY WITH PROPOFOL N/A 11/25/2016   Procedure: COLONOSCOPY WITH PROPOFOL;  Surgeon: Daneil Dolin, MD;  Location: [facility];  Service: Endoscopy;  Laterality: N/A;  10:15am   ENDOMETRIAL ABLATION     POLYPECTOMY  11/25/2016   Procedure: POLYPECTOMY;  Surgeon: Daneil Dolin, MD;  Location: [facility];  Service: Endoscopy;;  colon   TONSILLECTOMY     TUBAL LIGATION  1987    Prior to Admission medications   Medication Sig Start Date End Date Taking? Authorizing Provider  amLODipine-benazepril (LOTREL) 10-40 MG capsule Take 1 capsule by mouth daily. 01/14/18  Yes [provider]  Cholecalciferol (VITAMIN D3 PO) Take 1 capsule by mouth in the morning.   Yes [provider]  indapamide (LOZOL) 2.5 MG tablet Take 2.5 mg by mouth in the morning. 01/14/18  Yes [provider]  metFORMIN (GLUCOPHAGE-XR) 500 MG 24 hr tablet Take 500 mg by mouth in the morning.   Yes [provider]  polyethylene glycol-electrolytes (NULYTELY) 420 g solution Take 4,000 mLs by mouth as directed. 07/29/22  Yes [provider]  rosuvastatin (CRESTOR) 5 MG tablet Take 1 tablet (5 mg total) by mouth daily. Patient taking differently: Take 5 mg by mouth every 30 (thirty) days. 03/15/19  Yes Kathyrn Drown, MD  ONE TOUCH ULTRA TEST test strip USE ONE STRIP TO CHECK GLUCOSE UP TO ONCE DAILY Patient not taking: Reported on 12/11/2021  08/25/15   Kathyrn Drown, MD  tirzepatide [facility]) 2.5 MG/0.5ML Pen Inject 2.5 mg into skin weekly Patient not taking: Reported on 09/10/2022 07/05/22   Kathyrn Drown, MD    Allergies as of 07/29/2022   (No Known Allergies)    Family History  Problem Relation Age of Onset   Cancer Father 46       stomach cancer   Hypertension Father    CAD Father    Liver disease Mother        fatty liver, age 20   Heart disease Maternal Grandmother    Colon cancer Neg Hx     Social History   Socioeconomic History   Marital status: Divorced    Spouse name: Not on file   Number of children: 3   Years of education: Not on file   Highest education level: Not on file  Occupational History   Not on file  Tobacco Use   Smoking status: Never   Smokeless tobacco: Never  Vaping Use   Vaping Use: Never used  Substance and Sexual Activity   Alcohol use: No   Drug use: No   Sexual activity: Yes    Birth control/protection: Post-menopausal, Surgical  Other Topics Concern   Not on file  Social History Narrative   Not on file   Social Determinants of Health   Financial Resource Strain: Not on file  Food Insecurity: Not on file  Transportation Needs: Not on  file  Physical Activity: Not on file  Stress: Not on file  Social Connections: Not on file  Intimate Partner Violence: Not on file    Review of Systems: See HPI, otherwise negative ROS  Physical Exam: BP 125/85   Pulse (!) 59   Temp 98.4 F (36.9 C) (Oral)   Resp 10   Ht 5\' 7"  (1.702 m)   Wt 97.5 kg   SpO2 97%   BMI 33.67 kg/m  General:   Alert,  Well-developed, well-nourished, pleasant and cooperative in NAD Neck:  Supple; no masses or thyromegaly. No significant cervical adenopathy. Lungs:  Clear throughout to auscultation.   No wheezes, crackles, or rhonchi. No acute distress. Heart:  Regular rate and rhythm; no murmurs, clicks, rubs,  or gallops. Abdomen: Non-distended, normal bowel sounds.  Soft and nontender  without appreciable mass or hepatosplenomegaly.  Pulses:  Normal pulses noted. Extremities:  Without clubbing or edema.  Impression/Plan: 62 year old lady here for surveillance colonoscopy.  The risks, benefits, limitations, alternatives and imponderables have been reviewed with the patient. Questions have been answered. All parties are agreeable.       Notice: This dictation was prepared with Dragon dictation along with smaller phrase technology. Any transcriptional errors that result from this process are unintentional and may not be corrected upon review.

## 2022-09-13 NOTE — Discharge Instructions (Signed)
  Colonoscopy Discharge Instructions  Read the instructions outlined below and refer to this sheet in the next few weeks. These discharge instructions provide you with general information on caring for yourself after you leave the hospital. Your doctor may also give you specific instructions. While your treatment has been planned according to the most current medical practices available, unavoidable complications occasionally occur. If you have any problems or questions after discharge, call Dr. Gala Romney at [phone]. ACTIVITY You may resume your regular activity, but move at a slower pace for the next 24 hours.  Take frequent rest periods for the next 24 hours.  Walking will help get rid of the air and reduce the bloated feeling in your belly (abdomen).  No driving for 24 hours (because of the medicine (anesthesia) used during the test).   Do not sign any important legal documents or operate any machinery for 24 hours (because of the anesthesia used during the test).  NUTRITION Drink plenty of fluids.  You may resume your normal diet as instructed by your doctor.  Begin with a light meal and progress to your normal diet. Heavy or fried foods are harder to digest and may make you feel sick to your stomach (nauseated).  Avoid alcoholic beverages for 24 hours or as instructed.  MEDICATIONS You may resume your normal medications unless your doctor tells you otherwise.  WHAT YOU CAN EXPECT TODAY Some feelings of bloating in the abdomen.  Passage of more gas than usual.  Spotting of blood in your stool or on the toilet paper.  IF YOU HAD POLYPS REMOVED DURING THE COLONOSCOPY: No aspirin products for 7 days or as instructed.  No alcohol for 7 days or as instructed.  Eat a soft diet for the next 24 hours.  FINDING OUT THE RESULTS OF YOUR TEST Not all test results are available during your visit. If your test results are not back during the visit, make an appointment with your caregiver to find out the  results. Do not assume everything is normal if you have not heard from your caregiver or the medical facility. It is important for you to follow up on all of your test results.  SEEK IMMEDIATE MEDICAL ATTENTION IF: You have more than a spotting of blood in your stool.  Your belly is swollen (abdominal distention).  You are nauseated or vomiting.  You have a temperature over 101.  You have abdominal pain or discomfort that is severe or gets worse throughout the day.    No polyps found today!    It is recommended you return for repeat colonoscopy in 7 years   at patient request, I called Jeannette Corpus at [phone] -

## 2022-09-13 NOTE — Transfer of Care (Signed)
Immediate Anesthesia Transfer of Care Note  Patient: [name]  Procedure(s) Performed: COLONOSCOPY WITH PROPOFOL  Patient Location: Endoscopy Unit  Anesthesia Type:General  Level of Consciousness: awake and patient cooperative  Airway & Oxygen Therapy: Patient Spontanous Breathing  Post-op Assessment: Report given to RN and Post -op Vital signs reviewed and stable  Post vital signs: Reviewed and stable  Last Vitals:  Vitals Value Taken Time  BP 94/52 0758  Temp 97.8 0758  Pulse 59 0758  Resp 18 0758  SpO2 98 0758    Last Pain:  Vitals:   09/13/22 0737  TempSrc:   PainSc: 0-No pain      Patients Stated Pain Goal: 10 ([id number])  Complications: No notable events documented.

## 2022-09-13 NOTE — Op Note (Signed)
[facility] Patient Name: [name] Procedure Date: 09/13/2022 7:13 AM MRN: [id number] Date of Birth: 09/03/1960 Attending MD: Norvel Richards , MD, [id number] CSN: [id number] Age: 62 Admit Type: Outpatient Procedure:                Colonoscopy Indications:              High risk colon cancer surveillance: Personal                            history of colonic polyps Providers:                Norvel Richards, MD, Charlsie Quest. Theda Sers RN, RN,                            Raphael Gibney, Technician Referring MD:             Norvel Richards, MD Medicines:                Propofol per Anesthesia Complications:            No immediate complications. Estimated Blood Loss:     Estimated blood loss: none. Procedure:                Pre-Anesthesia Assessment:                           - Prior to the procedure, a History and Physical                            was performed, and patient medications and                            allergies were reviewed. The patient's tolerance of                            previous anesthesia was also reviewed. The risks                            and benefits of the procedure and the sedation                            options and risks were discussed with the patient.                            All questions were answered, and informed consent                            was obtained. Prior Anticoagulants: The patient has                            taken no anticoagulant or antiplatelet agents. ASA                            Grade Assessment: II - A patient with mild systemic  disease. After reviewing the risks and benefits,                            the patient was deemed in satisfactory condition to                            undergo the procedure.                           After obtaining informed consent, the colonoscope                            was passed under direct vision. Throughout the                             procedure, the patient's blood pressure, pulse, and                            oxygen saturations were monitored continuously. The                            [id number]) scope was introduced through the                            anus and advanced to the the cecum, identified by                            appendiceal orifice and ileocecal valve. The                            colonoscopy was performed without difficulty. The                            patient tolerated the procedure well. The quality                            of the bowel preparation was adequate. The                            ileocecal valve, appendiceal orifice, and rectum                            were photographed. The entire colon was well                            visualized. Scope In: 7:39:52 AM Scope Out: 7:51:01 AM Scope Withdrawal Time: 0 hours 7 minutes 23 seconds  Total Procedure Duration: 0 hours 11 minutes 9 seconds  Findings:      The perianal and digital rectal examinations were normal.      The colon (entire examined portion) appeared normal.      The retroflexed view of the distal rectum and anal verge was normal and       showed no anal or rectal abnormalities. Impression:               -  The entire examined colon is normal.                           - The distal rectum and anal verge are normal on                            retroflexion view.                           - No specimens collected. Moderate Sedation:      Moderate (conscious) sedation was personally administered by an       anesthesia professional. The following parameters were monitored: oxygen       saturation, heart rate, blood pressure, respiratory rate, EKG, adequacy       of pulmonary ventilation, and response to care. Recommendation:           - Patient has a contact number available for                            emergencies. The signs and symptoms of potential                            delayed complications were  discussed with the                            patient. Return to normal activities tomorrow.                            Written discharge instructions were provided to the                            patient.                           - Advance diet as tolerated.                           - Continue present medications.                           - Repeat colonoscopy in 7 years for surveillance.                           - Return to GI office (date not yet determined). Procedure Code(s):        --- Professional ---                           [phone], Colonoscopy, flexible; diagnostic, including                            collection of specimen(s) by brushing or washing,                            when performed (separate procedure) Diagnosis Code(s):        --- Professional ---  Z86.010, Personal history of colonic polyps CPT copyright 2022 American Medical Association. All rights reserved. The codes documented in this report are preliminary and upon coder review may  be revised to meet current compliance requirements. [name]. Tammy Krizan, MD Norvel Richards, MD 09/13/2022 7:56:03 AM This report has been signed electronically. Number of Addenda: 0

## 2022-09-13 NOTE — Anesthesia Preprocedure Evaluation (Addendum)
Anesthesia Evaluation  Patient identified by MRN, date of birth, ID band Patient awake    Reviewed: Allergy & Precautions, H&P , NPO status , Patient's Chart, lab work & pertinent test results  History of Anesthesia Complications (+) PONV, Family history of anesthesia reaction and history of anesthetic complications  Airway Mallampati: III  TM Distance: >3 FB Neck ROM: Full    Dental no notable dental hx. (+) Dental Advisory Given, Missing   Pulmonary neg pulmonary ROS   Pulmonary exam normal breath sounds clear to auscultation       Cardiovascular Exercise Tolerance: Good hypertension, Pt. on medications Normal cardiovascular exam Rhythm:Regular Rate:Normal  Left ventricle: The cavity size was normal. Wall thickness was    increased in a pattern of mild LVH. Systolic function was normal.    The estimated ejection fraction was in the range of 60% to 65%.    Wall motion was normal; there were no regional wall motion    abnormalities. Left ventricular diastolic function parameters    were normal.  - Aortic valve: Mildly calcified annulus. Trileaflet; normal    thickness leaflets. Valve area (VTI): 3.01 cm^2. Valve area    (Vmax): 3.39 cm^2.  - Atrial septum: No defect or patent foramen ovale was identified.  - Technically adequate study.     Neuro/Psych negative neurological ROS  negative psych ROS   GI/Hepatic negative GI ROS, Neg liver ROS,,,  Endo/Other  diabetes, Well Controlled, Type 2, Oral Hypoglycemic Agents    Renal/GU negative Renal ROS  negative genitourinary   Musculoskeletal negative musculoskeletal ROS (+)    Abdominal   Peds negative pediatric ROS (+)  Hematology negative hematology ROS (+)   Anesthesia Other Findings   Reproductive/Obstetrics negative OB ROS                             Anesthesia Physical Anesthesia Plan  ASA: 2  Anesthesia Plan: General    Post-op Pain Management: Minimal or no pain anticipated   Induction: Intravenous  PONV Risk Score and Plan: 1 and Propofol infusion  Airway Management Planned: Nasal Cannula and Natural Airway  Additional Equipment:   Intra-op Plan:   Post-operative Plan:   Informed Consent: I have reviewed the patients History and Physical, chart, labs and discussed the procedure including the risks, benefits and alternatives for the proposed anesthesia with the patient or authorized representative who has indicated his/her understanding and acceptance.     Dental advisory given  Plan Discussed with: CRNA and Surgeon  Anesthesia Plan Comments:        Anesthesia Quick Evaluation

## 2022-09-22 ENCOUNTER — Encounter (HOSPITAL_COMMUNITY): Payer: Self-pay | Admitting: Internal Medicine

## 2022-10-05 ENCOUNTER — Encounter: Payer: Self-pay | Admitting: "Endocrinology

## 2022-10-05 ENCOUNTER — Ambulatory Visit: Payer: BC Managed Care – PPO | Admitting: "Endocrinology

## 2022-10-05 VITALS — BP 146/78 | HR 60 | Ht 66.0 in | Wt 224.6 lb

## 2022-10-05 DIAGNOSIS — E212 Other hyperparathyroidism: Secondary | ICD-10-CM

## 2022-10-05 NOTE — Progress Notes (Unsigned)
Endocrinology Consult Note       10/05/2022, 6:25 PM  [name] is a 62 y.o.-year-old female, referred by her  Tammy Sciara, MD  , for evaluation for hypercalcemia/hyperparathyroidism.   Past Medical History:  Diagnosis Date   Diabetes mellitus without complication    Family history of adverse reaction to anesthesia    mom has PONV   Hyperlipidemia    Hypertension    Impaired fasting glucose 09/2011    Past Surgical History:  Procedure Laterality Date   COLONOSCOPY WITH PROPOFOL N/A 11/25/2016   Procedure: COLONOSCOPY WITH PROPOFOL;  Surgeon: Tammy Ade, MD;  [facility];  Service: Endoscopy;  Laterality: N/A;  10:15am   COLONOSCOPY WITH PROPOFOL N/A 09/13/2022   Procedure: COLONOSCOPY WITH PROPOFOL;  Surgeon: Tammy Ade, MD;  [facility];  Service: Endoscopy;  Laterality: N/A;  730am, asa 2   ENDOMETRIAL ABLATION     POLYPECTOMY  11/25/2016   Procedure: POLYPECTOMY;  Surgeon: Tammy Ade, MD;  [facility];  Service: Endoscopy;;  colon   TONSILLECTOMY     TUBAL LIGATION  1987    Social History   Tobacco Use   Smoking status: Never   Smokeless tobacco: Never  Vaping Use   Vaping Use: Never used  Substance Use Topics   Alcohol use: No   Drug use: No    Family History  Problem Relation Age of Onset   Liver disease Mother        fatty liver, age 48   Hyperlipidemia Mother    Hyperlipidemia Father    Diabetes Father    Cancer Father 25       stomach cancer   Hypertension Father    CAD Father    Stroke Father    Heart disease Maternal Grandmother    Colon cancer Neg Hx     Outpatient Encounter Medications as of 10/05/2022  Medication Sig   amLODipine-benazepril (LOTREL) 10-40 MG capsule Take 1 capsule by mouth daily.   Cholecalciferol (VITAMIN D3 PO) Take 1 capsule by mouth in the morning.   indapamide (LOZOL) 2.5 MG tablet Take 2.5 mg by mouth in the  morning.   metFORMIN (GLUCOPHAGE-XR) 500 MG 24 hr tablet Take 500 mg by mouth in the morning.   ONE TOUCH ULTRA TEST test strip USE ONE STRIP TO CHECK GLUCOSE UP TO ONCE DAILY (Patient not taking: Reported on 12/11/2021)   polyethylene glycol-electrolytes (NULYTELY) 420 g solution Take 4,000 mLs by mouth as directed.   rosuvastatin (CRESTOR) 5 MG tablet Take 1 tablet (5 mg total) by mouth daily. (Patient taking differently: Take 5 mg by mouth every 30 (thirty) days.)   [DISCONTINUED] tirzepatide (MOUNJARO) 2.5 MG/0.5ML Pen Inject 2.5 mg into skin weekly (Patient not taking: Reported on 09/10/2022)   No facility-administered encounter medications on file as of 10/05/2022.    No Known Allergies   HPI  [name] was diagnosed with hypercalcemia in July 2022.  Patient has no previously known history of parathyroid, pituitary, adrenal dysfunctions; no family history of such dysfunctions. -Review of herreferral package of most recent labs reveals calcium of  10.5 the corresponding PTH of 60 on February 20, 2021. Patient does not have any record of DEXA. No prior history of fragility fractures or falls. No history of  kidney stones. She has normal renal function.  She is not on hydrochlorothiazide or other thiazide therapy. -Her vitamin D status is not known.  she is not on calcium supplements,  she eats dairy and green, leafy, vegetables on average amounts.  she does not have a family history of hypercalcemia, pituitary tumors, thyroid cancer, or osteoporosis.  Her other medical problems include hypertension on Lotrel and hyperlipidemia on Crestor.   ROS:  Constitutional: + Fluctuating body weight,  no fatigue, no subjective hyperthermia, no subjective hypothermia Eyes: no blurry vision, no xerophthalmia ENT: no sore throat, no nodules palpated in throat, no dysphagia/odynophagia, no hoarseness Cardiovascular: no Chest Pain, no Shortness of Breath, no palpitations, no leg  swelling Respiratory: no cough, no shortness of breath  Gastrointestinal: no Nausea/Vomiting/Diarhhea Musculoskeletal: no muscle/joint aches Skin: no rashes Neurological: no tremors, no numbness, no tingling, no dizziness Psychiatric: no depression, no anxiety  PE: BP (!) 146/78 Comment: R arm with manuel cuff. Pt states she has not taken her BP medication today.  Pulse 60   Ht 5\' 6"  (1.676 m)   Wt 224 lb 9.6 oz (101.9 kg)   BMI 36.25 kg/m , Body mass index is 36.25 kg/m. Wt Readings from Last 3 Encounters:  10/05/22 224 lb 9.6 oz (101.9 kg)  09/13/22 215 lb (97.5 kg)  06/25/22 232 lb (105.2 kg)    Constitutional: + BMI of 36.25, not in acute distress, normal state of mind Eyes: PERRLA, EOMI, no exophthalmos ENT: moist mucous membranes, no gross thyromegaly, no gross cervical lymphadenopathy Cardiovascular: normal precordial activity, Regular Rate and Rhythm, no Murmur/Rubs/Gallops Respiratory:  adequate breathing efforts, no gross chest deformity, Clear to auscultation bilaterally Gastrointestinal: abdomen soft, Non -tender, No distension, Bowel Sounds present Musculoskeletal: no gross deformities, strength intact in all four extremities Skin: moist, warm, no rashes Neurological: no tremor with outstretched hands, Deep tendon reflexes normal in bilateral lower extremities.     CMP ( most recent) CMP     Component Value Date/Time   NA 139 07/02/2022 1035   K 4.2 07/02/2022 1035   CL 100 07/02/2022 1035   CO2 22 07/02/2022 1035   GLUCOSE 105 (H) 07/02/2022 1035   GLUCOSE 98 04/18/2018 0209   BUN 18 07/02/2022 1035   CREATININE 0.88 07/02/2022 1035   CALCIUM 10.5 (H) 07/02/2022 1035   PROT 7.4 07/02/2022 1035   ALBUMIN 4.6 07/02/2022 1035   AST 18 07/02/2022 1035   ALT 21 07/02/2022 1035   ALKPHOS 72 07/02/2022 1035   BILITOT 0.4 07/02/2022 1035   GFRNONAA >60 04/18/2018 0209   GFRAA >60 04/18/2018 0209     Diabetic Labs (most recent): Lab Results  Component  Value Date   HGBA1C 6.2 (H) 07/02/2022   HGBA1C 6.0 10/06/2021   HGBA1C 5.4 12/12/2017     Lipid Panel ( most recent) Lipid Panel     Component Value Date/Time   CHOL 154 07/02/2022 1035   TRIG 60 07/02/2022 1035   HDL 57 07/02/2022 1035   CHOLHDL 2.7 07/02/2022 1035   CHOLHDL 3.6 04/18/2018 0209   VLDL 19 04/18/2018 0209   LDLCALC 85 07/02/2022 1035   LABVLDL 12 07/02/2022 1035      Lab Results  Component Value Date   TSH 1.092 04/18/2018      Assessment: 1. Hypercalcemia / Hyperparathyroidism  Plan: Patient has had several instances of elevated calcium, with the highest level being at 10.7 mg/dL. A corresponding intact PTH level was also high, at 60.  -Her vitamin D status is not known. - No apparent complications from hypercalcemia/hyperparathyroidism: no history of  nephrolithiasis,  osteoporosis,fragility fractures. No abdominal pain, no major mood disorders, no bone pain.  - I discussed with the patient about the physiology of calcium and parathyroid hormone, and possible  effects of  increased PTH/ Calcium , including kidney stones, cardiac dysrhythmias, osteoporosis, abdominal pain, etc.   - The work up so far is not sufficient to reach a conclusion for definitive therapy.  she  needs more studies to confirm and classify the parathyroid dysfunction she may have.    It is also essential to obtain 24-hour urine calcium/creatinine to rule out the rare but important cause of mild elevation in calcium and PTH- FHH ( Familial Hypocalciuric Hypercalcemia), which may not require any active intervention.  - I will request for her next DEXA scan to include the distal  33% of  radius for evaluation of cortical bone, which is predominantly affected by hyperparathyroidism.   She is advised to continue follow-up with her PMD for primary care.  - Time spent with the patient: 45 minutes, of which >50% was spent in obtaining information about her symptoms, reviewing her previous  labs, evaluations, and treatments, counseling her about her hypercalcemia/hyperparathyroidism, and developing a plan to confirm the diagnosis and long term treatment as necessary.  Please refer to " Patient Self Inventory" in the Media  tab for reviewed elements of pertinent patient history.  Margarite Gouge participated in the discussions, expressed understanding, and voiced agreement with the above plans.  All questions were answered to her satisfaction. she is encouraged to contact clinic should she have any questions or concerns prior to her return visit.  - Return in about 2 weeks (around 10/19/2022) for 24 Hr Urine Ca & Cr, DXA Scan B4 NV.   Marquis Lunch, MD [facility] [address] [place], [place] [address] Phone: [phone]  Fax: [phone]    This note was partially dictated with voice recognition software. Similar sounding words can be transcribed inadequately or may not  be corrected upon review.  10/05/2022, 6:25 PM

## 2022-10-08 ENCOUNTER — Ambulatory Visit: Payer: BC Managed Care – PPO | Admitting: Family Medicine

## 2022-10-15 DIAGNOSIS — E212 Other hyperparathyroidism: Secondary | ICD-10-CM | POA: Diagnosis not present

## 2022-10-16 LAB — CALCIUM, URINE, 24 HOUR
Calcium, 24H Urine: 19 mg/24 hr (ref 0–320)
Calcium, Urine: 1.6 mg/dL

## 2022-10-16 LAB — CREATININE, URINE, 24 HOUR
Creatinine, 24H Ur: 760 mg/24 hr — ABNORMAL LOW (ref 800–1800)
Creatinine, Urine: 63.3 mg/dL

## 2022-10-22 ENCOUNTER — Ambulatory Visit: Payer: BC Managed Care – PPO | Admitting: "Endocrinology

## 2022-10-22 ENCOUNTER — Ambulatory Visit (HOSPITAL_COMMUNITY)
Admission: RE | Admit: 2022-10-22 | Discharge: 2022-10-22 | Disposition: A | Discharge status: Home / Self Care | Payer: BC Managed Care – PPO | Source: Ambulatory Visit | Attending: "Endocrinology | Admitting: "Endocrinology

## 2022-10-22 DIAGNOSIS — Z780 Asymptomatic menopausal state: Secondary | ICD-10-CM | POA: Diagnosis not present

## 2022-10-22 DIAGNOSIS — E212 Other hyperparathyroidism: Secondary | ICD-10-CM | POA: Diagnosis not present

## 2022-10-27 ENCOUNTER — Ambulatory Visit: Payer: BC Managed Care – PPO | Admitting: "Endocrinology

## 2022-10-27 ENCOUNTER — Encounter: Payer: Self-pay | Admitting: "Endocrinology

## 2022-10-27 VITALS — BP 126/78 | HR 60 | Ht 66.0 in | Wt 224.6 lb

## 2022-10-27 DIAGNOSIS — R7303 Prediabetes: Secondary | ICD-10-CM

## 2022-10-27 DIAGNOSIS — E212 Other hyperparathyroidism: Secondary | ICD-10-CM

## 2022-10-27 DIAGNOSIS — Z6836 Body mass index (BMI) 36.0-36.9, adult: Secondary | ICD-10-CM

## 2022-10-27 NOTE — Progress Notes (Signed)
Endocrinology follow-up note     10/27/2022, 4:59 PM  [name] is a 62 y.o.-year-old female, referred by her  Tammy Sciara, MD  , for evaluation for hypercalcemia/hyperparathyroidism.   Past Medical History:  Diagnosis Date   Diabetes mellitus without complication (HCC)    Family history of adverse reaction to anesthesia    mom has PONV   Hyperlipidemia    Hypertension    Impaired fasting glucose 09/2011    Past Surgical History:  Procedure Laterality Date   COLONOSCOPY WITH PROPOFOL N/A 11/25/2016   Procedure: COLONOSCOPY WITH PROPOFOL;  Surgeon: Corbin Ade, MD;  [facility];  Service: Endoscopy;  Laterality: N/A;  10:15am   COLONOSCOPY WITH PROPOFOL N/A 09/13/2022   Procedure: COLONOSCOPY WITH PROPOFOL;  Surgeon: Corbin Ade, MD;  [facility];  Service: Endoscopy;  Laterality: N/A;  730am, asa 2   ENDOMETRIAL ABLATION     POLYPECTOMY  11/25/2016   Procedure: POLYPECTOMY;  Surgeon: Corbin Ade, MD;  [facility];  Service: Endoscopy;;  colon   TONSILLECTOMY     TUBAL LIGATION  1987    Social History   Tobacco Use   Smoking status: Never   Smokeless tobacco: Never  Vaping Use   Vaping Use: Never used  Substance Use Topics   Alcohol use: No   Drug use: No    Family History  Problem Relation Age of Onset   Liver disease Mother        fatty liver, age 15   Hyperlipidemia Mother    Hyperlipidemia Father    Diabetes Father    Cancer Father 3       stomach cancer   Hypertension Father    CAD Father    Stroke Father    Heart disease Maternal Grandmother    Colon cancer Neg Hx     Outpatient Encounter Medications as of 10/27/2022  Medication Sig   amLODipine-benazepril (LOTREL) 10-40 MG capsule Take 1 capsule by mouth daily.   Cholecalciferol (VITAMIN D3 PO) Take 1 capsule by mouth in the morning.   indapamide (LOZOL) 2.5 MG tablet Take 2.5 mg by mouth in the morning.   metFORMIN (GLUCOPHAGE-XR) 500 MG 24 hr  tablet Take 500 mg by mouth in the morning.   ONE TOUCH ULTRA TEST test strip USE ONE STRIP TO CHECK GLUCOSE UP TO ONCE DAILY (Patient not taking: Reported on 12/11/2021)   polyethylene glycol-electrolytes (NULYTELY) 420 g solution Take 4,000 mLs by mouth as directed.   rosuvastatin (CRESTOR) 5 MG tablet Take 1 tablet (5 mg total) by mouth daily. (Patient taking differently: Take 5 mg by mouth every 30 (thirty) days.)   No facility-administered encounter medications on file as of 10/27/2022.    No Known Allergies   HPI  [name] was diagnosed with hypercalcemia in July 2022.  Patient has no previously known history of parathyroid, pituitary, adrenal dysfunctions; no family history of such dysfunctions. She is returning with repeat labs including 24-hour urine calcium measurement.  Her 24-hour urine calcium is not elevated.  Her bone density on Oct 22, 2022 is normal. -Review of herreferral package of most recent labs reveals calcium of 10.5 the corresponding PTH of 60 on February 20, 2021.  No prior history of fragility fractures or falls. No history of  kidney stones. She has normal renal function.  She is not on hydrochlorothiazide or other thiazide therapy. -Her vitamin D status is not known.  she is not on calcium supplements,  she eats dairy and green, leafy, vegetables on average amounts.  she does not have a family history of hypercalcemia, pituitary tumors, thyroid cancer, or osteoporosis.  Her other medical problems include hypertension on Lotrel and hyperlipidemia on Crestor.   ROS:  Constitutional: + Fluctuating body weight,  no fatigue, no subjective hyperthermia, no subjective hypothermia Eyes: no blurry vision, no xerophthalmia ENT: no sore throat, no nodules palpated in throat, no dysphagia/odynophagia, no hoarseness   PE: BP 126/78   Pulse 60   Ht 5\' 6"  (1.676 m)   Wt 224 lb 9.6 oz (101.9 kg)   BMI 36.25 kg/m , Body mass index is 36.25 kg/m. Wt Readings from  Last 3 Encounters:  10/27/22 224 lb 9.6 oz (101.9 kg)  10/05/22 224 lb 9.6 oz (101.9 kg)  09/13/22 215 lb (97.5 kg)    Constitutional: + BMI of 36.25, not in acute distress, normal state of mind Eyes: PERRLA, EOMI, no exophthalmos ENT: moist mucous membranes, no gross thyromegaly, no gross cervical lymphadenopathy Cardiovascular: normal precordial activity, Regular Rate and Rhythm, no Murmur/Rubs/Gallops   CMP ( most recent) CMP     Component Value Date/Time   NA 139 07/02/2022 1035   K 4.2 07/02/2022 1035   CL 100 07/02/2022 1035   CO2 22 07/02/2022 1035   GLUCOSE 105 (H) 07/02/2022 1035   GLUCOSE 98 04/18/2018 0209   BUN 18 07/02/2022 1035   CREATININE 0.88 07/02/2022 1035   CALCIUM 10.5 (H) 07/02/2022 1035   PROT 7.4 07/02/2022 1035   ALBUMIN 4.6 07/02/2022 1035   AST 18 07/02/2022 1035   ALT 21 07/02/2022 1035   ALKPHOS 72 07/02/2022 1035   BILITOT 0.4 07/02/2022 1035   GFRNONAA >60 04/18/2018 0209   GFRAA >60 04/18/2018 0209     Diabetic Labs (most recent): Lab Results  Component Value Date   HGBA1C 6.2 (H) 07/02/2022   HGBA1C 6.0 10/06/2021   HGBA1C 5.4 12/12/2017     Lipid Panel ( most recent) Lipid Panel     Component Value Date/Time   CHOL 154 07/02/2022 1035   TRIG 60 07/02/2022 1035   HDL 57 07/02/2022 1035   CHOLHDL 2.7 07/02/2022 1035   CHOLHDL 3.6 04/18/2018 0209   VLDL 19 04/18/2018 0209   LDLCALC 85 07/02/2022 1035   LABVLDL 12 07/02/2022 1035      Lab Results  Component Value Date   TSH 1.092 04/18/2018      Assessment: 1. Hypercalcemia / Hyperparathyroidism   2.  Obesity with hypertension, hyperlipidemia    3.  Prediabetes  Plan: Patient has had several instances of elevated calcium, with the highest level being at 10.7 mg/dL. A corresponding intact PTH level was also high, at 60.  -Her vitamin D level is sufficient at 50.2.   - No apparent complications from hypercalcemia/hyperparathyroidism: no history of  nephrolithiasis,   osteoporosis, fragility fractures. No abdominal pain, no major mood disorders, no bone pain. -Her previsit DEXA scan shows normal findings.  - I discussed with the patient about the physiology of calcium and parathyroid hormone, and possible  effects of  increased PTH/ Calcium , including kidney stones, cardiac dysrhythmias, osteoporosis, abdominal pain, etc.  -Her 24-hour urine calcium is not elevated.  At this point she does not have a strong indication for surgical exploration. -There is some possibility of FHH given very low 24-hour urine calcium at 19 mg over 24 hours.  She will be put on observation status.  -  Regarding her obesity, prediabetes, hyperlipidemia, she remains a good candidate for lifestyle medicine.  - she acknowledges that there is a room for improvement in her food and drink choices. - Suggestion is made for her to avoid simple carbohydrates  from her diet including Cakes, Sweet Desserts, Ice Cream, Soda (diet and regular), Sweet Tea, Candies, Chips, Cookies, Store Bought Juices, Alcohol , Artificial Sweeteners,  Coffee Creamer, and "Sugar-free" Products, Lemonade. This will help patient to have more stable blood glucose profile and potentially avoid unintended weight gain.  The following Lifestyle Medicine recommendations according to American College of Lifestyle Medicine  [facility]) were discussed and and offered to patient and she  agrees to start the journey:  A. Whole Foods, Plant-Based Nutrition comprising of fruits and vegetables, plant-based proteins, whole-grain carbohydrates was discussed in detail with the patient.   A list for source of those nutrients were also provided to the patient.  Patient will use only water or unsweetened tea for hydration. B.  The need to stay away from risky substances including alcohol, smoking; obtaining 7 to 9 hours of restorative sleep, at least 150 minutes of moderate intensity exercise weekly, the importance of healthy social connections,   and stress management techniques were discussed. C.  A full color page of  Calorie density of various food groups per pound showing examples of each food groups was provided to the patient.    She is advised to continue follow-up with her PMD for primary care.   I spent  26  minutes in the care of the patient today including review of labs from Thyroid Function, CMP, and other relevant labs ; imaging/biopsy records (current and previous including abstractions from other facilities); face-to-face time discussing  her lab results and symptoms, medications doses, her options of short and long term treatment based on the latest standards of care / guidelines;   and documenting the encounter.  Margarite Gouge  participated in the discussions, expressed understanding, and voiced agreement with the above plans.  All questions were answered to her satisfaction. she is encouraged to contact clinic should she have any questions or concerns prior to her return visit.   - Return in about 6 months (around 04/29/2023) for F/U with Pre-visit Labs.   Marquis Lunch, MD [facility] [address] [place], [place] [address] Phone: [phone]  Fax: [phone]    This note was partially dictated with voice recognition software. Similar sounding words can be transcribed inadequately or may not  be corrected upon review.  10/27/2022, 4:59 PM

## 2022-12-24 ENCOUNTER — Ambulatory Visit: Payer: BC Managed Care – PPO | Admitting: Family Medicine

## 2022-12-28 ENCOUNTER — Encounter: Payer: Self-pay | Admitting: Family Medicine

## 2023-03-28 ENCOUNTER — Other Ambulatory Visit (HOSPITAL_COMMUNITY): Payer: Self-pay | Admitting: Family Medicine

## 2023-03-28 DIAGNOSIS — Z1231 Encounter for screening mammogram for malignant neoplasm of breast: Secondary | ICD-10-CM

## 2023-04-08 ENCOUNTER — Ambulatory Visit (HOSPITAL_COMMUNITY)
Admission: RE | Admit: 2023-04-08 | Discharge: 2023-04-08 | Disposition: A | Discharge status: Home / Self Care | Payer: BC Managed Care – PPO | Source: Ambulatory Visit | Attending: Family Medicine | Admitting: Family Medicine

## 2023-04-08 DIAGNOSIS — Z1231 Encounter for screening mammogram for malignant neoplasm of breast: Secondary | ICD-10-CM | POA: Insufficient documentation

## 2023-04-21 DIAGNOSIS — Z23 Encounter for immunization: Secondary | ICD-10-CM | POA: Diagnosis not present

## 2023-04-27 ENCOUNTER — Telehealth: Payer: Self-pay | Admitting: "Endocrinology

## 2023-04-27 DIAGNOSIS — E212 Other hyperparathyroidism: Secondary | ICD-10-CM

## 2023-04-27 DIAGNOSIS — E559 Vitamin D deficiency, unspecified: Secondary | ICD-10-CM

## 2023-04-27 NOTE — Telephone Encounter (Signed)
Can someone update these labs, she had to move appt to March

## 2023-04-27 NOTE — Telephone Encounter (Signed)
Order updated

## 2023-04-29 ENCOUNTER — Ambulatory Visit: Payer: BC Managed Care – PPO | Admitting: "Endocrinology

## 2023-06-03 ENCOUNTER — Ambulatory Visit: Payer: BC Managed Care – PPO | Admitting: Physician Assistant

## 2023-06-03 ENCOUNTER — Encounter: Payer: Self-pay | Admitting: Physician Assistant

## 2023-06-03 ENCOUNTER — Ambulatory Visit: Payer: BC Managed Care – PPO | Admitting: Family Medicine

## 2023-06-03 VITALS — BP 152/90 | HR 68 | Ht 66.0 in | Wt 227.2 lb

## 2023-06-03 DIAGNOSIS — G5602 Carpal tunnel syndrome, left upper limb: Secondary | ICD-10-CM

## 2023-06-03 NOTE — Progress Notes (Signed)
Acute Office Visit  Subjective:     Patient ID: [name], female    DOB: [birth year], 62 y.o.   MRN: [id number]   Arm Pain  Associated symptoms include tingling. Pertinent negatives include no chest pain.   Patient is in today for concerns of the numbness and tingling in her left hand, specifically her thumb and first 2 digits.  She states occasional achiness up her forearm to mid upper arm.  She states symptoms have been going on for approximately 2 months.  She occasionally experiences shoulder pain which she describes as achiness or heaviness.  Patient has been experiencing what she describes as itchiness in affected area of her hand.  She denies weakness, loss of sensation, or decreased range of motion.  She states symptoms are only bothersome at rest, she does not notice numbness or tingling while she is doing her day-to-day activities.  Review of Systems  Constitutional:  Negative for fever and malaise/fatigue.  Respiratory: Negative.  Negative for shortness of breath.   Cardiovascular: Negative.  Negative for chest pain.  Gastrointestinal:  Negative for heartburn and nausea.  Musculoskeletal:  Negative for back pain and neck pain.  Skin:  Positive for itching.  Neurological:  Positive for tingling. Negative for weakness.        Objective:     BP (!) 152/90   Pulse 68   Ht 5\' 6"  (1.676 m)   Wt 227 lb 3.2 oz (103.1 kg)   SpO2 95%   BMI 36.67 kg/m   Physical Exam Constitutional:      Appearance: Normal appearance.  Eyes:     Extraocular Movements: Extraocular movements intact.     Conjunctiva/sclera: Conjunctivae normal.  Cardiovascular:     Rate and Rhythm: Normal rate and regular rhythm.     Pulses: Normal pulses.     Heart sounds: No murmur heard.    No friction rub. No gallop.  Pulmonary:     Effort: Pulmonary effort is normal.     Breath sounds: Normal breath sounds. No stridor. No wheezing, rhonchi or rales.  Musculoskeletal:     Left hand: No  tenderness. Normal range of motion. Normal strength. Normal sensation. Normal capillary refill. Normal pulse.     Cervical back: Normal range of motion. No tenderness.     Comments: Positive phelan test  Skin:    General: Skin is warm and dry.     Capillary Refill: Capillary refill takes less than 2 seconds.  Neurological:     General: No focal deficit present.     Mental Status: She is alert and oriented to person, place, and time.     Sensory: No sensory deficit.     Motor: No weakness.  Psychiatric:        Mood and Affect: Mood normal.        Behavior: Behavior normal.     No results found for any visits on 06/03/23.      Assessment & Plan:  Carpal tunnel syndrome of left wrist   Patient's exam overall negative today.  No signs of significant neuro symptoms such as weakness, sensory loss, decreased pulses.  Patient's symptom distribution consistent with the median nerve.  We discussed treatment options such as bracing the wrist at night, ergonomics with typing on her keyboard, and intermittent use of NSAIDs as needed for symptom relief.  Patient will follow-up if her symptoms do not improve or worsen.  At this time would consider referral for steroid injection.  Return  if symptoms worsen or fail to improve.  Toni Amend Roshad Hack, PA-C

## 2023-08-22 ENCOUNTER — Ambulatory Visit: Payer: BC Managed Care – PPO | Admitting: "Endocrinology

## 2023-09-16 DIAGNOSIS — E559 Vitamin D deficiency, unspecified: Secondary | ICD-10-CM | POA: Diagnosis not present

## 2023-09-16 DIAGNOSIS — E212 Other hyperparathyroidism: Secondary | ICD-10-CM | POA: Diagnosis not present

## 2023-09-17 LAB — PTH, INTACT AND CALCIUM
Calcium: 11 mg/dL — ABNORMAL HIGH (ref 8.7–10.3)
PTH: 51 pg/mL (ref 15–65)

## 2023-09-17 LAB — TSH: TSH: 0.761 u[IU]/mL (ref 0.450–4.500)

## 2023-09-17 LAB — VITAMIN D 25 HYDROXY (VIT D DEFICIENCY, FRACTURES): Vit D, 25-Hydroxy: 38.7 ng/mL (ref 30.0–100.0)

## 2023-09-17 LAB — T4, FREE: Free T4: 1.17 ng/dL (ref 0.82–1.77)

## 2023-09-26 ENCOUNTER — Ambulatory Visit: Admitting: "Endocrinology

## 2023-09-26 ENCOUNTER — Encounter: Payer: Self-pay | Admitting: "Endocrinology

## 2023-09-26 VITALS — BP 142/98 | HR 68 | Ht 66.0 in | Wt 222.2 lb

## 2023-09-26 DIAGNOSIS — R7303 Prediabetes: Secondary | ICD-10-CM | POA: Diagnosis not present

## 2023-09-26 DIAGNOSIS — E785 Hyperlipidemia, unspecified: Secondary | ICD-10-CM | POA: Diagnosis not present

## 2023-09-26 DIAGNOSIS — E559 Vitamin D deficiency, unspecified: Secondary | ICD-10-CM

## 2023-09-26 DIAGNOSIS — E212 Other hyperparathyroidism: Secondary | ICD-10-CM

## 2023-09-26 NOTE — Progress Notes (Signed)
 Endocrinology follow-up note     09/26/2023, 3:58 PM  [name] is a 63 y.o.-year-old female, referred by her  Babs Sciara, MD  , for evaluation for hypercalcemia/hyperparathyroidism.   Past Medical History:  Diagnosis Date   Diabetes mellitus without complication (HCC)    Family history of adverse reaction to anesthesia    mom has PONV   Hyperlipidemia    Hypertension    Impaired fasting glucose 09/2011    Past Surgical History:  Procedure Laterality Date   COLONOSCOPY WITH PROPOFOL N/A 11/25/2016   Procedure: COLONOSCOPY WITH PROPOFOL;  Surgeon: Corbin Ade, MD;  Location: [facility];  Service: Endoscopy;  Laterality: N/A;  10:15am   COLONOSCOPY WITH PROPOFOL N/A 09/13/2022   Procedure: COLONOSCOPY WITH PROPOFOL;  Surgeon: Corbin Ade, MD;  Location: [facility];  Service: Endoscopy;  Laterality: N/A;  730am, asa 2   ENDOMETRIAL ABLATION     POLYPECTOMY  11/25/2016   Procedure: POLYPECTOMY;  Surgeon: Corbin Ade, MD;  Location: [facility];  Service: Endoscopy;;  colon   TONSILLECTOMY     TUBAL LIGATION  1987    Social History   Tobacco Use   Smoking status: Never   Smokeless tobacco: Never  Vaping Use   Vaping status: Never Used  Substance Use Topics   Alcohol use: No   Drug use: No    Family History  Problem Relation Age of Onset   Liver disease Mother        fatty liver, age 37   Hyperlipidemia Mother    Hyperlipidemia Father    Diabetes Father    Cancer Father 31       stomach cancer   Hypertension Father    CAD Father    Stroke Father    Heart disease Maternal Grandmother    Colon cancer Neg Hx     Outpatient Encounter Medications as of 09/26/2023  Medication Sig   amLODipine-benazepril (LOTREL) 10-40 MG capsule Take 1 capsule by mouth daily.   Cholecalciferol (VITAMIN D3 PO) Take 1 capsule by mouth in the morning.   indapamide (LOZOL) 2.5 MG tablet Take 2.5 mg by mouth in the morning.   metFORMIN (GLUCOPHAGE-XR) 500 MG 24  hr tablet Take 500 mg by mouth in the morning.   ONE TOUCH ULTRA TEST test strip USE ONE STRIP TO CHECK GLUCOSE UP TO ONCE DAILY   rosuvastatin (CRESTOR) 5 MG tablet Take 1 tablet (5 mg total) by mouth daily. (Patient taking differently: Take 5 mg by mouth every 30 (thirty) days.)   No facility-administered encounter medications on file as of 09/26/2023.    No Known Allergies   HPI  [name] was diagnosed with hypercalcemia in July 2022.  Patient has no previously known history of parathyroid, pituitary, adrenal dysfunctions; no family history of such dysfunctions. She is returning with repeat labs showing hypercalcemia at 11, PTH inappropriately normal at 51.  Her previous 24-hour urine calcium was low at 19 mg / 24 hours.    Her bone density on Oct 22, 2022 is normal. -Review of herreferral package of most recent labs reveals calcium of 10.5 the corresponding PTH of 60 on February 20, 2021.  No prior history of fragility fractures or falls. No history of  kidney stones. She has normal renal function.  She is not on hydrochlorothiazide or other thiazide therapy. -Her vitamin D status is not known.  she is not on calcium supplements,  she  eats dairy and green, leafy, vegetables on average amounts.  she does not have a family history of hypercalcemia, pituitary tumors, thyroid cancer, or osteoporosis.  Her other medical problems include hypertension on Lotrel and hyperlipidemia on Crestor.   ROS:  Constitutional: + Fluctuating body weight,  no fatigue, no subjective hyperthermia, no subjective hypothermia Eyes: no blurry vision, no xerophthalmia ENT: no sore throat, no nodules palpated in throat, no dysphagia/odynophagia, no hoarseness   PE: BP (!) 142/98 Comment: R arm with manual cuff. Pt states she has not taken her medication this morning.  Pulse 68   Ht 5\' 6"  (1.676 m)   Wt 222 lb 3.2 oz (100.8 kg)   BMI 35.86 kg/m , Body mass index is 35.86 kg/m. Wt Readings from Last 3  Encounters:  09/26/23 222 lb 3.2 oz (100.8 kg)  06/03/23 227 lb 3.2 oz (103.1 kg)  10/27/22 224 lb 9.6 oz (101.9 kg)    Constitutional: + BMI of 36.25, not in acute distress, normal state of mind Eyes: PERRLA, EOMI, no exophthalmos ENT: moist mucous membranes, no gross thyromegaly, no gross cervical lymphadenopathy Cardiovascular: normal precordial activity, Regular Rate and Rhythm, no Murmur/Rubs/Gallops   CMP ( most recent) CMP     Component Value Date/Time   NA 139 07/02/2022 1035   K 4.2 07/02/2022 1035   CL 100 07/02/2022 1035   CO2 22 07/02/2022 1035   GLUCOSE 105 (H) 07/02/2022 1035   GLUCOSE 98 04/18/2018 0209   BUN 18 07/02/2022 1035   CREATININE 0.88 07/02/2022 1035   CALCIUM 11.0 (H) 09/16/2023 1039   PROT 7.4 07/02/2022 1035   ALBUMIN 4.6 07/02/2022 1035   AST 18 07/02/2022 1035   ALT 21 07/02/2022 1035   ALKPHOS 72 07/02/2022 1035   BILITOT 0.4 07/02/2022 1035   GFRNONAA >60 04/18/2018 0209   GFRAA >60 04/18/2018 0209     Diabetic Labs (most recent): Lab Results  Component Value Date   HGBA1C 6.2 (H) 07/02/2022   HGBA1C 6.0 10/06/2021   HGBA1C 5.4 12/12/2017     Lipid Panel ( most recent) Lipid Panel     Component Value Date/Time   CHOL 154 07/02/2022 1035   TRIG 60 07/02/2022 1035   HDL 57 07/02/2022 1035   CHOLHDL 2.7 07/02/2022 1035   CHOLHDL 3.6 04/18/2018 0209   VLDL 19 04/18/2018 0209   LDLCALC 85 07/02/2022 1035   LABVLDL 12 07/02/2022 1035      Lab Results  Component Value Date   TSH 0.761 09/16/2023   TSH 1.092 04/18/2018   FREET4 1.17 09/16/2023      Assessment: 1. Hypercalcemia / Hyperparathyroidism   2.  Obesity with hypertension, hyperlipidemia    3.  Prediabetes  Plan: Patient has had several instances of mild to moderately elevated calcium, with the highest level being at 11 mg/dL with a corresponding PTH of 51.  Previously she did have PTH as high as 60.     -Her vitamin D level is sufficient at 50.2.   - No  apparent complications from hypercalcemia/hyperparathyroidism: no history of  nephrolithiasis,  osteoporosis, fragility fractures. No abdominal pain, no major mood disorders, no bone pain. -Her previsit DEXA scan shows normal findings.  - I discussed with the patient about the physiology of calcium and parathyroid hormone, and possible  effects of  increased PTH/ Calcium , including kidney stones, cardiac dysrhythmias, osteoporosis, abdominal pain, etc.  -Her 24-hour urine calcium is not elevated, FHH is not ruled out.  She will not  need active intervention at this time, will be considered for repeat of the 24-hour urine calcium measurement before her next visit.     She will be put on observation status.  -Regarding her obesity, prediabetes, hyperlipidemia, she remains a good candidate for lifestyle medicine.  - she acknowledges that there is a room for improvement in her food and drink choices. - Suggestion is made for her to avoid simple carbohydrates  from her diet including Cakes, Sweet Desserts, Ice Cream, Soda (diet and regular), Sweet Tea, Candies, Chips, Cookies, Store Bought Juices, Alcohol , Artificial Sweeteners,  Coffee Creamer, and "Sugar-free" Products, Lemonade. This will help patient to have more stable blood glucose profile and potentially avoid unintended weight gain.  The following Lifestyle Medicine recommendations according to American College of Lifestyle Medicine  [facility]) were discussed and and offered to patient and she  agrees to start the journey:  A. Whole Foods, Plant-Based Nutrition comprising of fruits and vegetables, plant-based proteins, whole-grain carbohydrates was discussed in detail with the patient.   A list for source of those nutrients were also provided to the patient.  Patient will use only water or unsweetened tea for hydration. B.  The need to stay away from risky substances including alcohol, smoking; obtaining 7 to 9 hours of restorative sleep, at least  150 minutes of moderate intensity exercise weekly, the importance of healthy social connections,  and stress management techniques were discussed. C.  A full color page of  Calorie density of various food groups per pound showing examples of each food groups was provided to the patient.   She is advised to continue follow-up with her PMD for primary care.    I spent  25  minutes in the care of the patient today including review of labs from Thyroid Function, CMP, and other relevant labs ; imaging/biopsy records (current and previous including abstractions from other facilities); face-to-face time discussing  her lab results and symptoms, medications doses, her options of short and long term treatment based on the latest standards of care / guidelines;   and documenting the encounter.  [name]  participated in the discussions, expressed understanding, and voiced agreement with the above plans.  All questions were answered to her satisfaction. she is encouraged to contact clinic should she have any questions or concerns prior to her return visit.   - Return in about 6 months (around 03/27/2024) for F/U with Pre-visit Labs, 24 Hr Urine Ca & Cr.   Marquis Lunch, MD [facility] [address] [place], [place] [address] Phone: [phone]  Fax: [phone]    This note was partially dictated with voice recognition software. Similar sounding words can be transcribed inadequately or may not  be corrected upon review.  09/26/2023, 3:58 PM

## 2023-12-02 ENCOUNTER — Ambulatory Visit: Admitting: Family Medicine

## 2023-12-02 ENCOUNTER — Ambulatory Visit: Payer: Self-pay | Admitting: Family Medicine

## 2023-12-02 ENCOUNTER — Ambulatory Visit: Attending: Family Medicine

## 2023-12-02 ENCOUNTER — Encounter: Payer: Self-pay | Admitting: Family Medicine

## 2023-12-02 VITALS — BP 132/74 | HR 71 | Temp 97.7°F | Ht 66.0 in | Wt 221.0 lb

## 2023-12-02 DIAGNOSIS — R002 Palpitations: Secondary | ICD-10-CM

## 2023-12-02 DIAGNOSIS — R7303 Prediabetes: Secondary | ICD-10-CM | POA: Diagnosis not present

## 2023-12-02 DIAGNOSIS — E7849 Other hyperlipidemia: Secondary | ICD-10-CM | POA: Diagnosis not present

## 2023-12-02 DIAGNOSIS — I10 Essential (primary) hypertension: Secondary | ICD-10-CM | POA: Diagnosis not present

## 2023-12-02 NOTE — Progress Notes (Unsigned)
 EP to read.

## 2023-12-02 NOTE — Progress Notes (Unsigned)
   Subjective:    Patient ID: [name], female    DOB: 03/09/1961, 63 y.o.   MRN: [id number]  HPI  Heart palpitations for the past 2 weeks daily, no chest pain or sob Worse wed to Thursday lasted longer  Discussed the use of AI scribe software for clinical note transcription with the patient, who gave verbal consent to proceed.  History of Present Illness   [name] is a 63 year old female who presents with palpitations.  She experiences palpitations described as a 'constant beat' that are particularly noticeable after eating. These episodes also occur if she goes too long without eating and have been triggered by consuming a boiled egg or egg salad without bread. No shortness of breath or chest pain accompanies these episodes.  The palpitations can occur at night, waking her around midnight and persisting into the early morning. She recalls a similar experience in her fifties, which resolved but has recently recurred. She has attempted to reduce caffeine intake, stopping coffee consumption, but the palpitations persist. She denies significant caffeine use otherwise.  Her physical activity includes line dancing, and she wants to start walking for exercise but is cautious due to her symptoms. No swelling in her ankles is noted, and she reports that her work is 'kind of slow' but manageable.      Review of Systems     Objective:   Physical Exam General-in no acute distress Eyes-no discharge Lungs-respiratory rate normal, CTA CV-no murmurs,RRR Extremities skin warm dry no edema Neuro grossly normal Behavior normal, alert No murmur       Assessment & Plan:  Assessment and Plan    Palpitations Palpitations postprandially and nocturnally without exertional dyspnea or angina. Differential includes benign PACs and atrial fibrillation. EKG and telemetry to assess rhythm. Labs to check electrolytes. - Perform EKG to assess current heart rhythm. - Order telemetry monitor for  7-14 days to evaluate heart rhythm over time. - Conduct basic lab work to check for electrolyte imbalances.  General Health Maintenance Cholesterol not checked in over a year. Previous results satisfactory. Re-evaluation of cholesterol, blood glucose, and proteinuria recommended. - Order cholesterol test. - Order blood glucose test. - Order spot urine test for protein.  Follow-up Follow-up with Doctor Nita in October. Complete tests and monitoring before visit. - Ensure completion of tests and monitoring before October follow-up with Doctor Nita.     1. Primary hypertension (Primary) Blood pressure good control continue current measures - Basic Metabolic Panel - Microalbumin/Creatinine Ratio, Urine  2. Palpitation Intermittent sometimes fast heart rate sometimes not sometimes just a few minutes sometimes last for 10 to 15 minutes no shortness of breath or chest pain monitor indicated may or may not need cardiology consult - EKG 12-Lead - LONG TERM MONITOR (3-14 DAYS)  3. Other hyperlipidemia Lab ordered - Lipid Panel  4. Prediabetes Lab ordered healthy diet portion control regular activity - Hemoglobin A1c - Basic Metabolic Panel  Follow-up 6 months follow-up sooner depending on results of tests above

## 2023-12-16 DIAGNOSIS — E7849 Other hyperlipidemia: Secondary | ICD-10-CM | POA: Diagnosis not present

## 2023-12-16 DIAGNOSIS — R7303 Prediabetes: Secondary | ICD-10-CM | POA: Diagnosis not present

## 2023-12-16 DIAGNOSIS — I10 Essential (primary) hypertension: Secondary | ICD-10-CM | POA: Diagnosis not present

## 2023-12-17 LAB — HEMOGLOBIN A1C
Est. average glucose Bld gHb Est-mCnc: 128 mg/dL
Hgb A1c MFr Bld: 6.1 % — ABNORMAL HIGH (ref 4.8–5.6)

## 2023-12-17 LAB — LIPID PANEL
Chol/HDL Ratio: 3.6 ratio (ref 0.0–4.4)
Cholesterol, Total: 193 mg/dL (ref 100–199)
HDL: 53 mg/dL (ref >39)
LDL Chol Calc (NIH): 128 mg/dL — ABNORMAL HIGH (ref 0–99)
Triglycerides: 67 mg/dL (ref 0–149)
VLDL Cholesterol Cal: 12 mg/dL (ref 5–40)

## 2023-12-17 LAB — BASIC METABOLIC PANEL WITH GFR
BUN/Creatinine Ratio: 24 (ref 12–28)
BUN: 23 mg/dL (ref 8–27)
CO2: 23 mmol/L (ref 20–29)
Calcium: 10.2 mg/dL (ref 8.7–10.3)
Chloride: 100 mmol/L (ref 96–106)
Creatinine, Ser: 0.94 mg/dL (ref 0.57–1.00)
Glucose: 102 mg/dL — ABNORMAL HIGH (ref 70–99)
Potassium: 3.8 mmol/L (ref 3.5–5.2)
Sodium: 140 mmol/L (ref 134–144)
eGFR: 69 mL/min/{1.73_m2} (ref >59)

## 2023-12-17 LAB — MICROALBUMIN / CREATININE URINE RATIO
Creatinine, Urine: 145.8 mg/dL
Microalb/Creat Ratio: 18 mg/g{creat} (ref 0–29)
Microalbumin, Urine: 26.7 ug/mL

## 2024-01-02 DIAGNOSIS — R002 Palpitations: Secondary | ICD-10-CM | POA: Diagnosis not present

## 2024-01-03 DIAGNOSIS — R002 Palpitations: Secondary | ICD-10-CM

## 2024-01-04 ENCOUNTER — Other Ambulatory Visit: Payer: Self-pay

## 2024-01-04 DIAGNOSIS — I517 Cardiomegaly: Secondary | ICD-10-CM

## 2024-01-05 ENCOUNTER — Emergency Department (HOSPITAL_COMMUNITY)
Admission: EM | Admit: 2024-01-05 | Discharge: 2024-01-05 | Disposition: A | Discharge status: Home / Self Care | Attending: Emergency Medicine | Admitting: Emergency Medicine

## 2024-01-05 ENCOUNTER — Emergency Department (HOSPITAL_COMMUNITY)

## 2024-01-05 ENCOUNTER — Other Ambulatory Visit: Payer: Self-pay

## 2024-01-05 DIAGNOSIS — I10 Essential (primary) hypertension: Secondary | ICD-10-CM | POA: Diagnosis not present

## 2024-01-05 DIAGNOSIS — Z7984 Long term (current) use of oral hypoglycemic drugs: Secondary | ICD-10-CM | POA: Diagnosis not present

## 2024-01-05 DIAGNOSIS — M7989 Other specified soft tissue disorders: Secondary | ICD-10-CM | POA: Diagnosis not present

## 2024-01-05 DIAGNOSIS — M25562 Pain in left knee: Secondary | ICD-10-CM | POA: Insufficient documentation

## 2024-01-05 DIAGNOSIS — Z79899 Other long term (current) drug therapy: Secondary | ICD-10-CM | POA: Diagnosis not present

## 2024-01-05 DIAGNOSIS — M1712 Unilateral primary osteoarthritis, left knee: Secondary | ICD-10-CM | POA: Diagnosis not present

## 2024-01-05 DIAGNOSIS — E119 Type 2 diabetes mellitus without complications: Secondary | ICD-10-CM | POA: Insufficient documentation

## 2024-01-05 MED ORDER — INDOMETHACIN 25 MG PO CAPS: 25.0000 mg | ORAL_CAPSULE | Freq: Three times a day (TID) | ORAL | 0 refills | Status: AC | PRN

## 2024-01-05 MED ORDER — NAPROXEN 250 MG PO TABS
500.0000 mg | ORAL_TABLET | Freq: Once | ORAL | Status: AC
  Administered 2024-01-05: 500 mg via ORAL
  Filled 2024-01-05: qty 2

## 2024-01-05 NOTE — ED Provider Notes (Signed)
 [place] [facility] DE[facility] AT [facility] Provider Note   CSN: [id number] Arrival date & time: 01/05/24  9260     Patient presents with: Leg Pain   [name] is a 63 y.o. female.    Leg Pain    This patient is a 63 year old female, she has a history of hypertension diabetes and high cholesterol, she has no prior injuries to her legs but states she has been having some knee pain especially on the left side, the knee is slightly swollen, she noticed that she has had some buckling of her knee when she tries to walk over the last day or so which occurred after she went line dancing and then tried to walk on the stairs when she felt some increasing pain in the left knee.  She has no fevers or chills no prior injury to the knee and no prior surgery of that lower extremity as well. Prior to Admission medications   Medication Sig Start Date End Date Taking? Authorizing Provider  indomethacin  (INDOCIN ) 25 MG capsule Take 1 capsule (25 mg total) by mouth 3 (three) times daily as needed. 01/05/24  Yes Cleotilde Rogue, MD  amLODipine -benazepril  (LOTREL) 10-40 MG capsule Take 1 capsule by mouth daily. 01/14/18   [provider]  Cholecalciferol (VITAMIN D3 PO) Take 1 capsule by mouth in the morning. 500 international unit    [provider]  indapamide  (LOZOL ) 2.5 MG tablet Take 2.5 mg by mouth in the morning. 01/14/18   [provider]  metFORMIN  (GLUCOPHAGE -XR) 500 MG 24 hr tablet Take 500 mg by mouth in the morning.    [provider]  ONE TOUCH ULTRA TEST test strip USE ONE STRIP TO CHECK GLUCOSE UP TO ONCE DAILY 08/25/15   Alphonsa Glendia LABOR, MD  rosuvastatin  (CRESTOR ) 5 MG tablet Take 1 tablet (5 mg total) by mouth daily. Patient taking differently: Take 5 mg by mouth every 30 (thirty) days. 03/15/19   Alphonsa Glendia LABOR, MD    Allergies: Patient has no known allergies.    Review of Systems  All other systems reviewed and are negative.   Updated  Vital Signs BP (!) 145/68   Pulse (!) 52   Temp 98.7 F (37.1 C) (Oral)   Ht 1.702 m (5' 7)   Wt 95.3 kg   SpO2 98%   BMI 32.89 kg/m   Physical Exam Vitals and nursing note reviewed.  Constitutional:      General: She is not in acute distress.    Appearance: She is well-developed.  HENT:     Head: Normocephalic and atraumatic.     Mouth/Throat:     Pharynx: No oropharyngeal exudate.  Eyes:     General: No scleral icterus.       Right eye: No discharge.        Left eye: No discharge.     Conjunctiva/sclera: Conjunctivae normal.     Pupils: Pupils are equal, round, and reactive to light.  Neck:     Thyroid : No thyromegaly.     Vascular: No JVD.  Cardiovascular:     Rate and Rhythm: Normal rate and regular rhythm.     Heart sounds: Normal heart sounds. No murmur heard.    No friction rub. No gallop.  Pulmonary:     Effort: Pulmonary effort is normal. No respiratory distress.     Breath sounds: Normal breath sounds. No wheezing or rales.  Abdominal:     General: Bowel sounds are normal.  There is no distension.     Palpations: Abdomen is soft. There is no mass.     Tenderness: There is no abdominal tenderness.  Musculoskeletal:        General: Tenderness present. Normal range of motion.     Cervical back: Normal range of motion and neck supple.     Right lower leg: No edema.     Left lower leg: No edema.     Comments: Mild tenderness of the left knee, there is a slight effusion of the knee clinically, she has good flexion to 90 degrees with minimal pain, she has trouble fully extending the knee because of pain, normal pulses at the feet  Lymphadenopathy:     Cervical: No cervical adenopathy.  Skin:    General: Skin is warm and dry.     Findings: No erythema or rash.  Neurological:     Mental Status: She is alert.     Coordination: Coordination normal.  Psychiatric:        Behavior: Behavior normal.     (all labs ordered are listed, but only abnormal results are  displayed) Labs Reviewed - No data to display  EKG: None  Radiology: DG Knee Complete 4 Views Left Result Date: 01/05/2024 CLINICAL DATA:  Patellar pain and swelling after twisting injury. EXAM: LEFT KNEE - COMPLETE 4+ VIEW COMPARISON:  None Available. 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 No acute fracture or dislocation. No joint effusion. Mild degenerative irregularity about the tibial spines. IMPRESSION: Degenerative change, without acute osseous finding. Electronically Signed   By: Rockey Kilts M.D.   On: 01/05/2024 09:08     Procedures   Medications Ordered in the ED  naproxen  (NAPROSYN ) tablet 500 mg (500 mg Oral Given 01/05/24 0846)                                    Medical Decision Making Amount and/or Complexity of Data Reviewed Radiology: ordered.  Risk Prescription drug management.    This patient presents to the ED for concern of left knee pain and effusion differential diagnosis includes internal derangement, ligamentous injury, meniscus tear, unlikely to be fracture or metastatic lesion but will obtain x-ray    Additional history obtained   Additional history obtained from Electronic Medical Record External records from outside source obtained and reviewed including medical record, office visits including recent visit about a month ago because of intermittent palpitations, hypertension, no recent admissions to the hospital   Lab Tests:  I Ordered, and personally interpreted labs.  The pertinent results include: Not indicated   Imaging Studies ordered:  I ordered imaging studies including left knee plain films I independently visualized and interpreted imaging which showed no acute findings I agree with the radiologist interpretation   Medicines ordered and prescription drug management:  I ordered medication including Naprosyn     I have reviewed the patients home medicines and have made adjustments as  needed   Problem List / ED Course:  Proved, x-rays unremarkable, crutches, knee immobilizer, follow-up with orthopedics Without fever, redness, deformity or findings on imaging I do not think the patient needs to have an arthrocentesis, this is very unlikely to be a septic joint, there could be some inflammatory findings but ultimately I think supportive care is reasonable with indomethacin  and orthopedic follow-up   Social Determinants of Health:  None  I have discussed with the patient at the bedside  the results, and the meaning of these results.  They have had opportunity to ask questions,  expressed their understanding to the need for follow-up with primary care physician      Final diagnoses:  Acute pain of left knee    ED Discharge Orders          Ordered    indomethacin  (INDOCIN ) 25 MG capsule  3 times daily PRN        01/05/24 0937               Cleotilde Rogue, MD 01/05/24 [phone]

## 2024-01-05 NOTE — Discharge Instructions (Signed)
 Your x-ray here was unremarkable, it did show some arthritis but nothing that was severe or serious or pathological.  I do want you to take a medication called indomethacin  1 tablet every 8 hours as needed for pain.  You can use the knee immobilizer and crutches to help you walk but if you do develop fevers severe pain or any worsening symptoms I want you to return to the ER immediately.  I have given you the phone number for the local orthopedic office with Dr. Margrette, I would call when you are done here to make an appointment to be seen within the next week.

## 2024-01-05 NOTE — ED Triage Notes (Addendum)
 Pt arrived to ED via POV. Pt complains of pain in left leg. Pt statesTues (6/15) felt a pull while dancing. Yesterday pt was going upstairs and leg buckled a little. Today she was walking in hallway at home and just had a slight pain. When she got to work she was having severe pain. Pt rates pain 10/10 when ambulating. Upon extending leg pt has severe knee pain. Pt Has good RoM of leg but is unable to extend leg without pain. No swelling or signs of deformity. Right leg is not affected. MD at bedside during triage.

## 2024-01-25 ENCOUNTER — Telehealth: Payer: Self-pay | Admitting: Family Medicine

## 2024-01-25 ENCOUNTER — Ambulatory Visit (HOSPITAL_COMMUNITY)
Admission: RE | Admit: 2024-01-25 | Discharge: 2024-01-25 | Disposition: A | Discharge status: Home / Self Care | Source: Ambulatory Visit | Attending: Family Medicine | Admitting: Family Medicine

## 2024-01-25 ENCOUNTER — Ambulatory Visit: Payer: Self-pay | Admitting: Family Medicine

## 2024-01-25 DIAGNOSIS — I517 Cardiomegaly: Secondary | ICD-10-CM

## 2024-01-25 DIAGNOSIS — E785 Hyperlipidemia, unspecified: Secondary | ICD-10-CM | POA: Diagnosis not present

## 2024-01-25 DIAGNOSIS — E119 Type 2 diabetes mellitus without complications: Secondary | ICD-10-CM | POA: Insufficient documentation

## 2024-01-25 DIAGNOSIS — I119 Hypertensive heart disease without heart failure: Secondary | ICD-10-CM | POA: Diagnosis not present

## 2024-01-25 LAB — ECHOCARDIOGRAM COMPLETE
Area-P 1/2: 3.56 cm2
S' Lateral: 3.25 cm

## 2024-01-25 NOTE — Telephone Encounter (Signed)
 Nurses Previously patient was having problems with palpitations We did order a echo The echo came back Does show left ventricular hypertrophy This is a thickening of the heart muscle Given that she has had palpitations and the thickening of the heart muscle it is reasonable to get a consultation with cardiology Please talk with patient and go ahead with cardiology consult Let the patient know that I doubt there is any worrisome findings but it would be wise to get the input of cardiologist regarding best approach Please move forward with a consult  A consultation with cardiology would be recommended Patient had a short run of PSVT on her telemetry and on her echo showed left ventricular hypertrophy Please refer to cardiology due to tachycardia and left ventricular hypertrophy

## 2024-01-26 ENCOUNTER — Other Ambulatory Visit: Payer: Self-pay

## 2024-01-26 DIAGNOSIS — R002 Palpitations: Secondary | ICD-10-CM

## 2024-01-26 DIAGNOSIS — I517 Cardiomegaly: Secondary | ICD-10-CM

## 2024-02-15 ENCOUNTER — Other Ambulatory Visit: Payer: Self-pay

## 2024-02-15 DIAGNOSIS — I517 Cardiomegaly: Secondary | ICD-10-CM

## 2024-03-15 NOTE — Progress Notes (Signed)
 Cardiology Office Note Date:  03/26/2024  ID:  [name], DOB [birth year], MRN [id number] PCP:  [name] LABOR, MD  Cardiologist:  Tammy VEAR Ren Donley, MD  No chief complaint on file.     Problems Palpitations Obesity Pre-DM on MTN500 HLD/HTN on AE-BL 10-40, Indapamide  2.5 and RN5  Visits  03/2024    History of Present Illness: [name] is a 63 y.o. female who presents for palpitations.   She is presenting for problem with palpitation for the last 4 months.  She has about 1-2 episode per day.  They typically last about 15 minutes but she had an episode at work while laughing that lasted about an hour.  She has also had episodes while laying down that lasted all night.  She denies any angina and any dyspnea with exertion.  She also denies orthopnea, PND, or lower extremity edema.  This occurs about 2 years ago and she had a workup that was negative and her symptoms eventually went.   ROS: Please see the history of present illness. All other systems are reviewed and negative.   Past Medical History:  Diagnosis Date   Diabetes mellitus without complication (HCC)    Family history of adverse reaction to anesthesia    mom has PONV   Hyperlipidemia    Hypertension    Impaired fasting glucose 09/2011    Past Surgical History:  Procedure Laterality Date   COLONOSCOPY WITH PROPOFOL  N/A 11/25/2016   Procedure: COLONOSCOPY WITH PROPOFOL ;  Surgeon: Tammy Lamar HERO, MD;  [facility];  Service: Endoscopy;  Laterality: N/A;  10:15am   COLONOSCOPY WITH PROPOFOL  N/A 09/13/2022   Procedure: COLONOSCOPY WITH PROPOFOL ;  Surgeon: Tammy Lamar HERO, MD;  [facility];  Service: Endoscopy;  Laterality: N/A;  730am, asa 2   ENDOMETRIAL ABLATION     POLYPECTOMY  11/25/2016   Procedure: POLYPECTOMY;  Surgeon: Tammy Lamar HERO, MD;  [facility];  Service: Endoscopy;;  colon   TONSILLECTOMY     TUBAL LIGATION  1987    Current Outpatient Medications   Medication Sig Dispense Refill   amLODipine -benazepril  (LOTREL) 10-40 MG capsule Take 1 capsule by mouth daily.     Cholecalciferol (VITAMIN D3 PO) Take 1 capsule by mouth in the morning. 500 international unit     indapamide  (LOZOL ) 2.5 MG tablet Take 2.5 mg by mouth in the morning.     indomethacin  (INDOCIN ) 25 MG capsule Take 1 capsule (25 mg total) by mouth 3 (three) times daily as needed. 30 capsule 0   metFORMIN  (GLUCOPHAGE -XR) 500 MG 24 hr tablet Take 500 mg by mouth in the morning.     ONE TOUCH ULTRA TEST test strip USE ONE STRIP TO CHECK GLUCOSE UP TO ONCE DAILY 50 each 5   rosuvastatin  (CRESTOR ) 5 MG tablet Take 1 tablet (5 mg total) by mouth daily. (Patient taking differently: Take 5 mg by mouth every 30 (thirty) days.) 30 tablet 5   No current facility-administered medications for this visit.    Allergies:   Patient has no known allergies.   Social History:  Noncontributory  Family History:  Father with MI in his 39s  PHYSICAL EXAM: VS:  BP (!) 180/91 (BP Location: Left Arm, Patient Position: Sitting, Cuff Size: Normal)   Pulse (!) 52   Ht 5' 7 (1.702 m)   Wt 222 lb (100.7 kg)   SpO2 98%   BMI 34.77 kg/m  , BMI Body mass  index is 34.77 kg/m. GEN: Well nourished, well developed, in no acute distress HEENT: normal Neck: no JVD, carotid bruits, or masses Cardiac: Bradycardic but regular rhythm; no murmurs, rubs, or gallops,no edema  Respiratory:  CTAB bilaterally, normal work of breathing GI: soft, nontender, nondistended, + BS Extremities: No LE edema Skin: warm and dry, no rash Neuro:  Strength and sensation are intact  EKG: Sinus bradycardia with LVH  Recent Labs: 09/16/2023: TSH 0.761 03/21/2024: ALT 13; BUN 22; Creatinine, Ser 0.89; Potassium 3.6; Sodium 140      Component Value Date/Time   CHOL 193 12/16/2023 0936   TRIG 67 12/16/2023 0936   HDL 53 12/16/2023 0936   CHOLHDL 3.6 12/16/2023 0936   CHOLHDL 3.6 04/18/2018 0209   VLDL 19 04/18/2018 0209    LDLCALC 128 (H) 12/16/2023 0936     Wt Readings from Last 5 Encounters:  03/26/24 222 lb (100.7 kg)  01/05/24 210 lb (95.3 kg)  12/02/23 221 lb (100.2 kg)  09/26/23 222 lb 3.2 oz (100.8 kg)  06/03/23 227 lb 3.2 oz (103.1 kg)     BP Readings from Last 5 Encounters:  03/26/24 (!) 180/91  01/05/24 (!) 145/68  12/02/23 132/74  09/26/23 (!) 142/98  06/03/23 (!) 152/90    Studies: Reviewed   ASSESSMENT AND PLAN: [name] is a 63 y.o. female who presents for palpitations.  - She is presenting with palpitation for the last 4 months that have occurred daily but denies any worsening angina or shortness of breath.  I suspect her symptoms are not cardiac.  We will obtain a Zio patch for 7 days.  She had a recent echo that showed normal EF 60 to 65% with no valvular dysfunction. - Given elevated blood pressure on top of 3 blood pressure medication, we will send for renin Aldo ratio.  I encouraged the patient to check her blood pressure at least 3 times a week. - We have also sent for lipoprotein a given family history of early ASCVD. - She needs to be on a higher dose intensity but unfortunately she has not tolerated anything above rosuvastatin  5 mg.  We will continue at this dose but should consider ezetimibe in the future.   Signed, Tammy VEAR Ren Donley, MD  03/26/2024 9:17 AM    [facility]

## 2024-03-16 ENCOUNTER — Other Ambulatory Visit (HOSPITAL_COMMUNITY): Payer: Self-pay | Admitting: Family Medicine

## 2024-03-16 DIAGNOSIS — Z1231 Encounter for screening mammogram for malignant neoplasm of breast: Secondary | ICD-10-CM

## 2024-03-21 DIAGNOSIS — E212 Other hyperparathyroidism: Secondary | ICD-10-CM | POA: Diagnosis not present

## 2024-03-22 LAB — COMPREHENSIVE METABOLIC PANEL WITH GFR
ALT: 13 IU/L (ref 0–32)
AST: 13 IU/L (ref 0–40)
Albumin: 4.6 g/dL (ref 3.9–4.9)
Alkaline Phosphatase: 78 IU/L (ref 49–135)
BUN/Creatinine Ratio: 25 (ref 12–28)
BUN: 22 mg/dL (ref 8–27)
Bilirubin Total: 0.4 mg/dL (ref 0.0–1.2)
CO2: 24 mmol/L (ref 20–29)
Calcium: 10.7 mg/dL — ABNORMAL HIGH (ref 8.7–10.3)
Chloride: 101 mmol/L (ref 96–106)
Creatinine, Ser: 0.89 mg/dL (ref 0.57–1.00)
Globulin, Total: 2.5 g/dL (ref 1.5–4.5)
Glucose: 106 mg/dL — ABNORMAL HIGH (ref 70–99)
Potassium: 3.6 mmol/L (ref 3.5–5.2)
Sodium: 140 mmol/L (ref 134–144)
Total Protein: 7.1 g/dL (ref 6.0–8.5)
eGFR: 73 mL/min/1.73 (ref >59)

## 2024-03-22 LAB — PTH, INTACT AND CALCIUM
Calcium: 10.6 mg/dL — ABNORMAL HIGH (ref 8.7–10.3)
PTH: 39 pg/mL (ref 15–65)

## 2024-03-22 LAB — CREATININE, URINE, 24 HOUR
Creatinine, 24H Ur: 845 mg/(24.h) (ref 800–1800)
Creatinine, Urine: 84.5 mg/dL

## 2024-03-22 LAB — CALCIUM, URINE, 24 HOUR
Calcium, 24H Urine: 54 mg/(24.h) (ref 0–320)
Calcium, Urine: 5.4 mg/dL

## 2024-03-26 ENCOUNTER — Ambulatory Visit

## 2024-03-26 VITALS — BP 180/91 | HR 52 | Ht 67.0 in | Wt 222.0 lb

## 2024-03-26 DIAGNOSIS — I10 Essential (primary) hypertension: Secondary | ICD-10-CM | POA: Diagnosis not present

## 2024-03-26 DIAGNOSIS — Z6832 Body mass index (BMI) 32.0-32.9, adult: Secondary | ICD-10-CM

## 2024-03-26 DIAGNOSIS — R002 Palpitations: Secondary | ICD-10-CM

## 2024-03-26 DIAGNOSIS — E66811 Obesity, class 1: Secondary | ICD-10-CM

## 2024-03-26 DIAGNOSIS — E6609 Other obesity due to excess calories: Secondary | ICD-10-CM

## 2024-03-26 DIAGNOSIS — R7303 Prediabetes: Secondary | ICD-10-CM | POA: Diagnosis not present

## 2024-03-26 DIAGNOSIS — E785 Hyperlipidemia, unspecified: Secondary | ICD-10-CM

## 2024-03-26 NOTE — Progress Notes (Unsigned)
 Enrolled for [employer] to mail a ZIO XT long term holter monitor to the patients address on file.

## 2024-03-26 NOTE — Patient Instructions (Addendum)
 Medication Instructions:  NO CHANGES  Lab Work: ALDOSTERONE/RENIN-A AND LIPOPROTEIN TO BE DONE TODAY.   Testing/Procedures: GEOFFRY HEWS- Long Term Monitor Instructions  Your physician has requested you wear a ZIO patch monitor for 7 days.  This is a single patch monitor. [employer] supplies one patch monitor per enrollment. Additional stickers are not available. Please do not apply patch if you will be having a Nuclear Stress Test,  Echocardiogram, Cardiac CT, MRI, or Chest Xray during the period you would be wearing the  monitor. The patch cannot be worn during these tests. You cannot remove and re-apply the  ZIO XT patch monitor.  Your ZIO patch monitor will be mailed 3 day USPS to your address on file. It may take 3-5 days  to receive your monitor after you have been enrolled.  Once you have received your monitor, please review the enclosed instructions. Your monitor  has already been registered assigning a specific monitor serial # to you.  Billing and Patient Assistance Program Information  We have supplied [employer] with any of your insurance information on file for billing purposes. [employer] offers a sliding scale Patient Assistance Program for patients that do not have  insurance, or whose insurance does not completely cover the cost of the ZIO monitor.  You must apply for the Patient Assistance Program to qualify for this discounted rate.  To apply, please call [employer] at [phone], select option 4, select option 2, ask to apply for  Patient Assistance Program. Meredeth will ask your household income, and how many people  are in your household. They will quote your out-of-pocket cost based on that information.  [employer] will also be able to set up a 54-month, interest-free payment plan if needed.  Applying the monitor   Shave hair from upper left chest.  Hold abrader disc by orange tab. Rub abrader in 40 strokes over the upper left chest as  indicated in your monitor instructions.   Clean area with 4 enclosed alcohol pads. Let dry.  Apply patch as indicated in monitor instructions. Patch will be placed under collarbone on left  side of chest with arrow pointing upward.  Rub patch adhesive wings for 2 minutes. Remove white label marked 1. Remove the white  label marked 2. Rub patch adhesive wings for 2 additional minutes.  While looking in a mirror, press and release button in center of patch. A small green light will  flash 3-4 times. This will be your only indicator that the monitor has been turned on.  Do not shower for the first 24 hours. You may shower after the first 24 hours.  Press the button if you feel a symptom. You will hear a small click. Record Date, Time and  Symptom in the Patient Logbook.  When you are ready to remove the patch, follow instructions on the last 2 pages of Patient  Logbook. Stick patch monitor onto the last page of Patient Logbook.  Place Patient Logbook in the blue and white box. Use locking tab on box and tape box closed  securely. The blue and white box has prepaid postage on it. Please place it in the mailbox as  soon as possible. Your physician should have your test results approximately 7 days after the  monitor has been mailed back to [facility].  Call [facility] Customer Care at [phone] if you have questions regarding  your ZIO XT patch monitor. Call them immediately if you see an orange light blinking on your  monitor.  If  your monitor falls off in less than 4 days, contact our Monitor department at [phone].  If your monitor becomes loose or falls off after 4 days call [employer] at [phone] for  suggestions on securing your monitor   Follow-Up: At [facility], you and your health needs are our priority.  As part of our continuing mission to provide you with exceptional heart care, our providers are all part of one team.  This team includes your primary Cardiologist (physician) and Advanced  Practice Providers or APPs (Physician Assistants and Nurse Practitioners) who all work together to provide you with the care you need, when you need it.  Your next appointment:   AS NEEDED  Provider:   DR. REN, MD

## 2024-03-27 ENCOUNTER — Ambulatory Visit: Admitting: "Endocrinology

## 2024-03-31 LAB — LIPOPROTEIN A (LPA): Lipoprotein (a): 78.3 nmol/L — ABNORMAL HIGH (ref <75.0)

## 2024-03-31 LAB — ALDOSTERONE + RENIN ACTIVITY W/ RATIO
Aldos/Renin Ratio: 3.5 (ref 0.0–30.0)
Aldosterone: 6.3 ng/dL (ref 0.0–30.0)
Renin Activity, Plasma: 1.813 ng/mL/h (ref 0.167–5.380)

## 2024-04-02 ENCOUNTER — Ambulatory Visit: Payer: Self-pay

## 2024-04-13 ENCOUNTER — Ambulatory Visit (HOSPITAL_COMMUNITY)
Admission: RE | Admit: 2024-04-13 | Discharge: 2024-04-13 | Disposition: A | Discharge status: Home / Self Care | Source: Ambulatory Visit | Attending: Family Medicine | Admitting: Family Medicine

## 2024-04-13 ENCOUNTER — Encounter (HOSPITAL_COMMUNITY): Payer: Self-pay

## 2024-04-13 DIAGNOSIS — Z1231 Encounter for screening mammogram for malignant neoplasm of breast: Secondary | ICD-10-CM | POA: Insufficient documentation

## 2024-04-18 DIAGNOSIS — R002 Palpitations: Secondary | ICD-10-CM | POA: Diagnosis not present

## 2024-04-27 ENCOUNTER — Ambulatory Visit (HOSPITAL_COMMUNITY)

## 2024-04-30 ENCOUNTER — Encounter: Payer: Self-pay | Admitting: "Endocrinology

## 2024-04-30 ENCOUNTER — Ambulatory Visit: Admitting: "Endocrinology

## 2024-04-30 VITALS — BP 132/76 | HR 68 | Ht 66.0 in | Wt 220.2 lb

## 2024-04-30 DIAGNOSIS — R7303 Prediabetes: Secondary | ICD-10-CM | POA: Diagnosis not present

## 2024-04-30 DIAGNOSIS — E782 Mixed hyperlipidemia: Secondary | ICD-10-CM | POA: Diagnosis not present

## 2024-04-30 DIAGNOSIS — E212 Other hyperparathyroidism: Secondary | ICD-10-CM

## 2024-04-30 MED ORDER — ROSUVASTATIN CALCIUM 5 MG PO TABS: 5.0000 mg | ORAL_TABLET | Freq: Every day | ORAL | 1 refills | Status: AC

## 2024-04-30 NOTE — Progress Notes (Signed)
 Endocrinology follow-up note     04/30/2024, 2:03 PM  [name] is a 63 y.o.-year-old female, referred by her  Alphonsa Glendia LABOR, MD  , for evaluation for hypercalcemia/hyperparathyroidism.   Past Medical History:  Diagnosis Date   Diabetes mellitus without complication (HCC)    Family history of adverse reaction to anesthesia    mom has PONV   Hyperlipidemia    Hypertension    Impaired fasting glucose 09/2011    Past Surgical History:  Procedure Laterality Date   COLONOSCOPY WITH PROPOFOL  N/A 11/25/2016   Procedure: COLONOSCOPY WITH PROPOFOL ;  Surgeon: Shaaron Lamar HERO, MD;  [facility];  Service: Endoscopy;  Laterality: N/A;  10:15am   COLONOSCOPY WITH PROPOFOL  N/A 09/13/2022   Procedure: COLONOSCOPY WITH PROPOFOL ;  Surgeon: Shaaron Lamar HERO, MD;  [facility];  Service: Endoscopy;  Laterality: N/A;  730am, asa 2   ENDOMETRIAL ABLATION     POLYPECTOMY  11/25/2016   Procedure: POLYPECTOMY;  Surgeon: Shaaron Lamar HERO, MD;  [facility];  Service: Endoscopy;;  colon   TONSILLECTOMY     TUBAL LIGATION  1987    Social History   Tobacco Use   Smoking status: Never   Smokeless tobacco: Never  Vaping Use   Vaping status: Never Used  Substance Use Topics   Alcohol use: No   Drug use: No    Family History  Problem Relation Age of Onset   Liver disease Mother        fatty liver, age 31   Hyperlipidemia Mother    Hyperlipidemia Father    Diabetes Father    Cancer Father 30       stomach cancer   Hypertension Father    CAD Father    Stroke Father    Heart disease Maternal Grandmother    Colon cancer Neg Hx     Outpatient Encounter Medications as of 04/30/2024  Medication Sig   [DISCONTINUED] rosuvastatin  (CRESTOR ) 5 MG tablet Take 1 tablet (5 mg total) by mouth daily.   amLODipine -benazepril  (LOTREL) 10-40 MG capsule Take 1 capsule by mouth daily.   Cholecalciferol (VITAMIN D3 PO) Take 1 capsule by mouth in the morning. 500  international unit   indapamide  (LOZOL ) 2.5 MG tablet Take 2.5 mg by mouth in the morning.   indomethacin  (INDOCIN ) 25 MG capsule Take 1 capsule (25 mg total) by mouth 3 (three) times daily as needed.   metFORMIN  (GLUCOPHAGE -XR) 500 MG 24 hr tablet Take 500 mg by mouth in the morning.   ONE TOUCH ULTRA TEST test strip USE ONE STRIP TO CHECK GLUCOSE UP TO ONCE DAILY   rosuvastatin  (CRESTOR ) 5 MG tablet Take 1 tablet (5 mg total) by mouth at bedtime.   No facility-administered encounter medications on file as of 04/30/2024.    No Known Allergies   HPI  [name] was diagnosed with hypercalcemia in July 2022.  Patient has no previously known history of parathyroid, pituitary, adrenal dysfunctions; no family history of such dysfunctions. She is returning with repeat labs showing hypercalcemia improving at 10.6 associated with PTH of 39. This profile is indicative of general improvement.   Her 24-hour urine study on repeat shows 54 Mg of calcium /24 hours , previously also low at 19.   No prior history of fragility fractures or falls.  She has no history of nephrolithiasis.  Her previous bone density was negative for osteoporosis/osteopenia.    She has normal renal function.  She is not on hydrochlorothiazide or other thiazide therapy. -Her vitamin D  status is not known.  she is not on calcium  supplements,  she eats dairy and green, leafy, vegetables on average amounts.  she does not have a family history of hypercalcemia, pituitary tumors, thyroid  cancer, or osteoporosis.  Her other medical problems include hypertension on Lotrel and hyperlipidemia on Crestor .   ROS:  Constitutional: + Fluctuating body weight,  no fatigue, no subjective hyperthermia, no subjective hypothermia Eyes: no blurry vision, no xerophthalmia ENT: no sore throat, no nodules palpated in throat, no dysphagia/odynophagia, no hoarseness   PE: BP 132/76   Pulse 68   Ht 5' 6 (1.676 m)   Wt 220 lb 3.2 oz (99.9  kg)   BMI 35.54 kg/m , Body mass index is 35.54 kg/m. Wt Readings from Last 3 Encounters:  04/30/24 220 lb 3.2 oz (99.9 kg)  03/26/24 222 lb (100.7 kg)  01/05/24 210 lb (95.3 kg)    Constitutional: + BMI of 36.25, not in acute distress, normal state of mind Eyes: PERRLA, EOMI, no exophthalmos ENT: moist mucous membranes, no gross thyromegaly, no gross cervical lymphadenopathy Cardiovascular: normal precordial activity, Regular Rate and Rhythm, no Murmur/Rubs/Gallops   CMP ( most recent) CMP     Component Value Date/Time   NA 140 03/21/2024 0852   K 3.6 03/21/2024 0852   CL 101 03/21/2024 0852   CO2 24 03/21/2024 0852   GLUCOSE 106 (H) 03/21/2024 0852   GLUCOSE 98 04/18/2018 0209   BUN 22 03/21/2024 0852   CREATININE 0.89 03/21/2024 0852   CALCIUM  10.6 (H) 03/21/2024 0855   PROT 7.1 03/21/2024 0852   ALBUMIN 4.6 03/21/2024 0852   AST 13 03/21/2024 0852   ALT 13 03/21/2024 0852   ALKPHOS 78 03/21/2024 0852   BILITOT 0.4 03/21/2024 0852   GFRNONAA >60 04/18/2018 0209   GFRAA >60 04/18/2018 0209     Diabetic Labs (most recent): Lab Results  Component Value Date   HGBA1C 6.1 (H) 12/16/2023   HGBA1C 6.2 (H) 07/02/2022   HGBA1C 6.0 10/06/2021     Lipid Panel ( most recent) Lipid Panel     Component Value Date/Time   CHOL 193 12/16/2023 0936   TRIG 67 12/16/2023 0936   HDL 53 12/16/2023 0936   CHOLHDL 3.6 12/16/2023 0936   CHOLHDL 3.6 04/18/2018 0209   VLDL 19 04/18/2018 0209   LDLCALC 128 (H) 12/16/2023 0936   LABVLDL 12 12/16/2023 0936      Lab Results  Component Value Date   TSH 0.761 09/16/2023   TSH 1.092 04/18/2018   FREET4 1.17 09/16/2023      Assessment: 1. Hypercalcemia / Hyperparathyroidism   2.  Obesity with hypertension, hyperlipidemia    3.  Prediabetes  Plan: Patient has had several instances of mild to moderately elevated calcium , with the highest level being at 11 mg/dL with a corresponding PTH of 51.  Previously she did have PTH as  high as 60.   Her previsit labs show improvement with calcium  at 10.6 associated with PTH of 39. Her 24-hour urine study on repeat shows 54 Mg of calcium /24 hours , previously also low at 19.   -Her vitamin D  level is sufficient at 50.2.   - No apparent complications from hypercalcemia/hyperparathyroidism: no history of  nephrolithiasis,  osteoporosis, fragility fractures. No abdominal pain, no major mood disorders, no bone pain. -Her previsit DEXA scan shows normal findings.  - I discussed with the patient about the physiology of calcium   and parathyroid hormone, and possible  effects of  increased PTH/ Calcium  , including kidney stones, cardiac dysrhythmias, osteoporosis, abdominal pain, etc.  - Her presentation so far is more indicative of FHH.   She will not need active intervention at this time, will be kept on expectant management.   She will have office visit with repeat CMP in 6 months.  -Regarding her obesity, prediabetes, hyperlipidemia, she remains a good candidate for lifestyle medicine.  - she acknowledges that there is a room for improvement in her food and drink choices. - Suggestion is made for her to avoid simple carbohydrates  from her diet including Cakes, Sweet Desserts, Ice Cream, Soda (diet and regular), Sweet Tea, Candies, Chips, Cookies, Store Bought Juices, Alcohol , Artificial Sweeteners,  Coffee Creamer, and Sugar-free Products, Lemonade. This will help patient to have more stable blood glucose profile and potentially avoid unintended weight gain.  The following Lifestyle Medicine recommendations according to American College of Lifestyle Medicine  [facility]) were discussed and and offered to patient and she  agrees to start the journey:  A. Whole Foods, Plant-Based Nutrition comprising of fruits and vegetables, plant-based proteins, whole-grain carbohydrates was discussed in detail with the patient.   A list for source of those nutrients were also provided to the patient.   Patient will use only water or unsweetened tea for hydration. B.  The need to stay away from risky substances including alcohol, smoking; obtaining 7 to 9 hours of restorative sleep, at least 150 minutes of moderate intensity exercise weekly, the importance of healthy social connections,  and stress management techniques were discussed. C.  A full color page of  Calorie density of various food groups per pound showing examples of each food groups was provided to the patient.   She is advised to continue Crestor  5 mg p.o. nightly, metformin  500 mg p.o. once a day at breakfast, vitamin D3 2000 units daily.  She is also on Lotrel10-40 mg p.o. daily to manage hypertension.  She is advised to continue follow-up with her PMD for primary care.   I spent  26  minutes in the care of the patient today including review of labs from Thyroid  Function, CMP, and other relevant labs ; imaging/biopsy records (current and previous including abstractions from other facilities); face-to-face time discussing  her lab results and symptoms, medications doses, her options of short and long term treatment based on the latest standards of care / guidelines;   and documenting the encounter.  Niels JONETTA Hurst  participated in the discussions, expressed understanding, and voiced agreement with the above plans.  All questions were answered to her satisfaction. she is encouraged to contact clinic should she have any questions or concerns prior to her return visit.    - Return in about 6 months (around 10/28/2024) for Fasting Labs  in AM B4 8.   Ranny Earl, MD [facility] [address] [place], [place] [address] Phone: [phone]  Fax: [phone]    This note was partially dictated with voice recognition software. Similar sounding words can be transcribed inadequately or may not  be corrected upon review.  04/30/2024, 2:03 PM

## 2024-05-01 DIAGNOSIS — R002 Palpitations: Secondary | ICD-10-CM

## 2024-05-11 ENCOUNTER — Ambulatory Visit (HOSPITAL_COMMUNITY)
Admission: RE | Admit: 2024-05-11 | Discharge: 2024-05-11 | Disposition: A | Discharge status: Home / Self Care | Source: Ambulatory Visit | Attending: Family Medicine | Admitting: Family Medicine

## 2024-05-11 ENCOUNTER — Encounter (HOSPITAL_COMMUNITY): Payer: Self-pay

## 2024-05-11 DIAGNOSIS — Z1231 Encounter for screening mammogram for malignant neoplasm of breast: Secondary | ICD-10-CM | POA: Diagnosis not present

## 2024-11-02 ENCOUNTER — Ambulatory Visit: Admitting: "Endocrinology
